# Patient Record
Sex: Female | Born: 1995
Health system: Southern US, Community
[De-identification: ages and names within clinical notes are randomized; demographics above are authoritative.]

## PROBLEM LIST (undated history)

## (undated) DIAGNOSIS — E282 Polycystic ovarian syndrome: Secondary | ICD-10-CM

## (undated) DIAGNOSIS — L309 Dermatitis, unspecified: Secondary | ICD-10-CM

## (undated) HISTORY — PX: OTHER SURGICAL HISTORY: SHX169

---
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
## 2017-05-07 MED FILL — AMOXICILLIN 500 MG CAPSULE: 500 | 7 days supply | Qty: 21 | Fill #0

## 2017-05-21 ENCOUNTER — Encounter: Payer: Self-pay | Admitting: Family Medicine

## 2017-05-21 ENCOUNTER — Ambulatory Visit (INDEPENDENT_AMBULATORY_CARE_PROVIDER_SITE_OTHER): Payer: 59 | Admitting: Family Medicine

## 2017-05-21 VITALS — BP 110/72 | HR 88 | Temp 98.6°F | Ht 64.0 in | Wt 221.2 lb

## 2017-05-21 DIAGNOSIS — Z0000 Encounter for general adult medical examination without abnormal findings: Secondary | ICD-10-CM

## 2017-05-21 DIAGNOSIS — Z114 Encounter for screening for human immunodeficiency virus [HIV]: Secondary | ICD-10-CM

## 2017-05-21 DIAGNOSIS — Z23 Encounter for immunization: Secondary | ICD-10-CM | POA: Diagnosis not present

## 2017-05-21 LAB — LIPID PANEL
CHOL/HDL RATIO: 3
Cholesterol: 138 mg/dL (ref 0–200)
HDL: 40.2 mg/dL (ref >39.00)
LDL Cholesterol: 84 mg/dL (ref 0–99)
NONHDL: 97.42
TRIGLYCERIDES: 69 mg/dL (ref 0.0–149.0)
VLDL: 13.8 mg/dL (ref 0.0–40.0)

## 2017-05-21 LAB — COMPREHENSIVE METABOLIC PANEL
ALK PHOS: 39 U/L — AB (ref 47–119)
ALT: 12 U/L (ref 0–35)
AST: 9 U/L (ref 0–37)
Albumin: 3.9 g/dL (ref 3.5–5.2)
BILIRUBIN TOTAL: 0.7 mg/dL (ref 0.2–1.2)
BUN: 10 mg/dL (ref 6–23)
CALCIUM: 9.4 mg/dL (ref 8.4–10.5)
CO2: 26 meq/L (ref 19–32)
CREATININE: 0.7 mg/dL (ref 0.40–1.20)
Chloride: 105 mEq/L (ref 96–112)
GFR: 138.16 mL/min (ref >60.00)
Glucose, Bld: 83 mg/dL (ref 70–99)
Potassium: 3.9 mEq/L (ref 3.5–5.1)
Sodium: 137 mEq/L (ref 135–145)
Total Protein: 6.8 g/dL (ref 6.0–8.3)

## 2017-05-21 LAB — CBC
HCT: 36.3 % (ref 36.0–49.0)
HEMOGLOBIN: 12.2 g/dL (ref 12.0–16.0)
MCHC: 33.7 g/dL (ref 31.0–37.0)
MCV: 92.3 fl (ref 78.0–98.0)
PLATELETS: 443 10*3/uL (ref 150.0–575.0)
RBC: 3.94 Mil/uL (ref 3.80–5.70)
RDW: 13.5 % (ref 11.4–15.5)
WBC: 6 10*3/uL (ref 4.5–13.5)

## 2017-05-21 NOTE — Progress Notes (Signed)
Pre visit review using our clinic review tool, if applicable. No additional management support is needed unless otherwise documented below in the visit note. 

## 2017-05-21 NOTE — Patient Instructions (Addendum)
Aim to do some physical exertion for 150 minutes per week. This is typically divided into 5 days per week, 30 minutes per day. The activity should be enough to get your heart rate up. Anything is better than nothing if you have time constraints.  Keep the diet clean.  Give us 2-3 business days to get the results of your labs back. If labs are normal, you will likely receive a letter in the mail unless you have MyChart. This can take longer than 2-3 business days.   Let us know if you need anything.

## 2017-05-21 NOTE — Progress Notes (Signed)
Chief Complaint  Patient presents with  . Annual Exam     Well Woman Lisa Franklin is here for a complete physical.   Her last physical was >1 year ago.  Current diet: in general, a "healthy" diet. Current exercise: walking. Weight is stable and she denies daytime fatigue. No LMP recorded.  Seatbelt? Yes  Health Maintenance STI- N/A  Tetanus- No HIV screening- No  Past Medical History:  Diagnosis Date  . Eczema   . PCOS (polycystic ovarian syndrome)      Past Surgical History:  Procedure Laterality Date  . Unremarkable      Medications  Takes no meds routinely.   Allergies No Known Allergies  Review of Systems: Constitutional:  no fevers Eye:  no recent significant change in vision Ear/Nose/Mouth/Throat:  Ears:  no tinnitus or vertigo and no recent change in hearing, Nose/Mouth/Throat:  no complaints of nasal congestion, no sore throat Cardiovascular: no chest pain, no palpitations Respiratory:  no cough and no shortness of breath Gastrointestinal:  no abdominal pain, no change in bowel habits, no significant change in appetite, no nausea, vomiting, diarrhea, or constipation and no black or bloody stool GU:  Female: negative for dysuria, frequency, and incontinence; no abnormal bleeding, pelvic pain, or discharge Musculoskeletal/Extremities:  no pain of the joints Integumentary (Skin/Breast):  no abnormal skin lesions reported Neurologic:  no headaches Endocrine:  denies fatigue Hematologic/Lymphatic:  no unexpected weight changes  Exam BP 110/72 (BP Location: Left Arm, Patient Position: Sitting, Cuff Size: Large)   Pulse 88   Temp 98.6 F (37 C) (Oral)   Ht 5\' 4"  (1.626 m)   Wt 221 lb 4 oz (100.4 kg)   SpO2 99%   BMI 37.98 kg/m  General:  well developed, well nourished, in no apparent distress Skin:  no significant moles, warts, or growths Head:  no masses, lesions, or tenderness Eyes:  pupils equal and round, sclera anicteric without injection Ears:   canals without lesions, TMs shiny without retraction, no obvious effusion, no erythema Nose:  nares patent, septum midline, mucosa normal, and no drainage or sinus tenderness Throat/Pharynx:  lips and gingiva without lesion; tongue and uvula midline; non-inflamed pharynx; no exudates or postnasal drainage Neck: neck supple without adenopathy, thyromegaly, or masses Breasts:  Not done Thorax:  nontender Lungs:  clear to auscultation, breath sounds equal bilaterally, no respiratory distress Cardio:  regular rate and rhythm without murmurs, heart sounds without clicks or rubs, point of maximal impulse normal; no lifts, heaves, or thrills Abdomen:  abdomen soft, nontender; bowel sounds normal; no masses or organomegaly Genital: Defer to GYN Musculoskeletal:  symmetrical muscle groups noted without atrophy or deformity Extremities:  no clubbing, cyanosis, or edema, no deformities, no skin discoloration Neuro:  gait normal; deep tendon reflexes normal and symmetric Psych: well oriented with normal range of affect and appropriate judgment/insight  Assessment and Plan  Well adult exam - Plan: CBC, Comprehensive metabolic panel, Lipid panel  Screening for HIV (human immunodeficiency virus) - Plan: HIV antibody   Well 22 y.o. female. Counseled on diet and exercise. Tdap today also given. Other orders as above. Follow up in 1 yr or prn. The patient voiced understanding and agreement to the plan.  Jilda Rocheicholas Paul [place], DO 05/21/17 8:34 AM

## 2017-05-21 NOTE — Addendum Note (Signed)
Addended by: Scharlene GlossEWING, ROBIN B on: 05/21/2017 08:43 AM   Modules accepted: Orders

## 2017-05-22 LAB — HIV ANTIBODY (ROUTINE TESTING W REFLEX): HIV 1&2 Ab, 4th Generation: NONREACTIVE

## 2017-10-23 ENCOUNTER — Encounter: Payer: Self-pay | Admitting: Obstetrics & Gynecology

## 2017-10-23 ENCOUNTER — Ambulatory Visit (INDEPENDENT_AMBULATORY_CARE_PROVIDER_SITE_OTHER): Payer: 59 | Admitting: Obstetrics & Gynecology

## 2017-10-23 VITALS — BP 120/67 | HR 67 | Ht 64.0 in | Wt 232.0 lb

## 2017-10-23 DIAGNOSIS — Z0000 Encounter for general adult medical examination without abnormal findings: Secondary | ICD-10-CM

## 2017-10-23 DIAGNOSIS — Z01419 Encounter for gynecological examination (general) (routine) without abnormal findings: Secondary | ICD-10-CM

## 2017-10-23 DIAGNOSIS — E282 Polycystic ovarian syndrome: Secondary | ICD-10-CM

## 2017-10-23 NOTE — Progress Notes (Signed)
Subjective:     Lisa Franklin is a 22 y.o. female here for a routine exam.  Current complaints: first GYN exam.  Pt is not sexually active.  Pt has never been on contraception. Pt was dx'd with PCOS for period control. Now pt is having cycles still irreg. Pt has a cycle once a month. Her cycle lasts 1 weeks and is heavy initially.  Her menses are assoc with cramping.   Pt is s/p STI testing since her episode of intercourse and it was neg.    Gynecologic History Patient's last menstrual period was 10/05/2017. Contraception: abstinence Last Pap: n/a.    Obstetric History OB History  Gravida Para Term Preterm AB Living  0 0 0 0 0 0  SAB TAB Ectopic Multiple Live Births  0 0 0 0 0    The following portions of the patient's history were reviewed and updated as appropriate: allergies, current medications, past family history, past medical history, past social history, past surgical history and problem list.  Review of Systems Pertinent items are noted in HPI.    Objective:  BP 120/67   Pulse 67   Ht 5\' 4"  (1.626 m)   Wt 232 lb (105.2 kg)   LMP 10/05/2017   BMI 39.82 kg/m  General Appearance:    Alert, cooperative, no distress, appears stated age  Head:    Normocephalic, without obvious abnormality, atraumatic  Eyes:    conjunctiva/corneas clear, EOM's intact, both eyes  Ears:    Normal external ear canals, both ears  Nose:   Nares normal, septum midline, mucosa normal, no drainage    or sinus tenderness  Throat:   Lips, mucosa, and tongue normal; teeth and gums normal  Neck:   Supple, symmetrical, trachea midline, no adenopathy;    thyroid:  no enlargement/tenderness/nodules  Back:     Symmetric, no curvature, ROM normal, no CVA tenderness  Lungs:     Clear to auscultation bilaterally, respirations unlabored  Chest Wall:    No tenderness or deformity   Heart:    Regular rate and rhythm, S1 and S2 normal, no murmur, rub   or gallop  Breast Exam:    No tenderness, masses, or  nipple abnormality  Abdomen:     Soft, non-tender, bowel sounds active all four quadrants,    no masses, no organomegaly  Genitalia:    Normal female without lesion, discharge or tenderness     Extremities:   Extremities normal, atraumatic, no cyanosis or edema  Pulses:   2+ and symmetric all extremities  Skin:   Skin color, texture, turgor normal, no rashes or lesions     Assessment:    Healthy female exam.   PCOS  Plan:    Follow up in: 1 year.    Reviewed weigh management and made suggestions for weight loss.  Inocente Krach L. Harraway-Smith, M.D., Evern CoreFACOG

## 2017-10-26 ENCOUNTER — Encounter: Payer: Self-pay | Admitting: Obstetrics & Gynecology

## 2018-03-11 ENCOUNTER — Encounter (HOSPITAL_BASED_OUTPATIENT_CLINIC_OR_DEPARTMENT_OTHER): Payer: Self-pay | Admitting: Emergency Medicine

## 2018-03-11 ENCOUNTER — Emergency Department (HOSPITAL_BASED_OUTPATIENT_CLINIC_OR_DEPARTMENT_OTHER)
Admission: EM | Admit: 2018-03-11 | Discharge: 2018-03-11 | Disposition: A | Discharge status: Home / Self Care | Payer: 59 | Attending: Emergency Medicine | Admitting: Emergency Medicine

## 2018-03-11 ENCOUNTER — Other Ambulatory Visit: Payer: Self-pay

## 2018-03-11 DIAGNOSIS — J020 Streptococcal pharyngitis: Secondary | ICD-10-CM | POA: Diagnosis not present

## 2018-03-11 DIAGNOSIS — R070 Pain in throat: Secondary | ICD-10-CM | POA: Diagnosis present

## 2018-03-11 DIAGNOSIS — J029 Acute pharyngitis, unspecified: Secondary | ICD-10-CM | POA: Insufficient documentation

## 2018-03-11 LAB — GROUP A STREP BY PCR: Group A Strep by PCR: NOT DETECTED

## 2018-03-11 MED ORDER — DEXAMETHASONE 6 MG PO TABS
10.0000 mg | ORAL_TABLET | Freq: Once | ORAL | Status: AC
  Administered 2018-03-11: 10 mg via ORAL
  Filled 2018-03-11: qty 1

## 2018-03-11 MED ORDER — CLINDAMYCIN HCL 300 MG PO CAPS: 300.0000 mg | ORAL_CAPSULE | Freq: Three times a day (TID) | ORAL | 0 refills | Status: AC

## 2018-03-11 MED FILL — CLINDAMYCIN HCL 300 MG CAPS: 300 | 10 days supply | Qty: 30 | Fill #0

## 2018-03-11 NOTE — ED Provider Notes (Signed)
[facility] EMERGENCY DEPARTMENT Provider Note   CSN: [id number] Arrival date & time: 03/11/18  [id number]     History   Chief Complaint Chief Complaint  Patient presents with  . Sore Throat    HPI Lisa Franklin is a 22 y.o. female.  The history is provided by the patient.  Sore Throat  This is a new problem. The current episode started more than 2 days ago. The problem occurs daily. The problem has not changed since onset.Pertinent negatives include no chest pain, no abdominal pain and no shortness of breath. Nothing aggravates the symptoms. Nothing relieves the symptoms. She has tried nothing for the symptoms. The treatment provided no relief.    Past Medical History:  Diagnosis Date  . Eczema   . PCOS (polycystic ovarian syndrome)     Patient Active Problem List   Diagnosis Date Noted  . Eczema 04/03/2015  . PCOS (polycystic ovarian syndrome) 10/13/2013  . Acanthosis nigricans 10/06/2013    Past Surgical History:  Procedure Laterality Date  . Unremarkable       OB History    Gravida  0   Para  0   Term  0   Preterm  0   AB  0   Living  0     SAB  0   TAB  0   Ectopic  0   Multiple  0   Live Births  0            Home Medications    Prior to Admission medications   Medication Sig Start Date End Date Taking? Authorizing Provider  clindamycin (CLEOCIN) 300 MG capsule Take 1 capsule (300 mg total) by mouth 3 (three) times daily for 10 days. 03/11/18 03/21/18  Virgina Norfolkuratolo, Santhiago Collingsworth, DO    Family History Family History  Problem Relation Age of Onset  . Hypertension Mother        Living  . Hyperlipidemia Mother   . Cancer - Other Mother        Endometrium  . GER disease Mother   . Hypertension Father        living  . Diabetes Maternal Grandmother   . Hypertension Maternal Grandmother   . Hyperlipidemia Maternal Grandmother   . Heart disease Maternal Grandfather   . Hypertension Maternal Grandfather   . Diabetes Maternal Aunt       x3  . Heart disease Maternal Aunt        x1  . Hypertension Maternal Aunt        x3  . Asthma Sister     Social History Social History   Tobacco Use  . Smoking status: Never Smoker  . Smokeless tobacco: Never Used  Substance Use Topics  . Alcohol use: No  . Drug use: No     Allergies   Patient has no known allergies.   Review of Systems Review of Systems  Constitutional: Negative for chills and fever.  HENT: Positive for sore throat. Negative for congestion, drooling, ear pain, facial swelling, mouth sores, nosebleeds, sinus pressure, sneezing, tinnitus, trouble swallowing and voice change.   Eyes: Negative for pain and visual disturbance.  Respiratory: Negative for cough and shortness of breath.   Cardiovascular: Negative for chest pain and palpitations.  Gastrointestinal: Negative for abdominal pain and vomiting.  Genitourinary: Negative for dysuria and hematuria.  Musculoskeletal: Negative for arthralgias and back pain.  Skin: Negative for color change and rash.  All other systems reviewed and are negative.  Physical Exam Updated Vital Signs  ED Triage Vitals  Enc Vitals Group     BP 03/11/18 0842 (!) 140/94     Pulse Rate 03/11/18 0842 80     Resp 03/11/18 0842 16     Temp 03/11/18 0842 98.8 F (37.1 C)     Temp Source 03/11/18 0842 Oral     SpO2 03/11/18 0842 99 %     Weight 03/11/18 0841 230 lb (104.3 kg)     Height 03/11/18 0841 5\' 4"  (1.626 m)     Head Circumference --      Peak Flow --      Pain Score 03/11/18 0841 10     Pain Loc --      Pain Edu? --      Excl. in GC? --     Physical Exam Vitals signs and nursing note reviewed.  Constitutional:      General: She is not in acute distress.    Appearance: She is well-developed.  HENT:     Head: Normocephalic and atraumatic.     Right Ear: Tympanic membrane normal. Tympanic membrane is not erythematous.     Left Ear: Tympanic membrane normal. Tympanic membrane is not erythematous.      Mouth/Throat:     Mouth: Mucous membranes are moist. No oral lesions.     Pharynx: Uvula midline. No pharyngeal swelling, oropharyngeal exudate, posterior oropharyngeal erythema or uvula swelling.     Tonsils: Tonsillar exudate present. No tonsillar abscesses. Swelling: 2+ on the right. 3+ on the left.  Eyes:     Conjunctiva/sclera: Conjunctivae normal.  Neck:     Musculoskeletal: Neck supple.  Cardiovascular:     Rate and Rhythm: Normal rate and regular rhythm.     Heart sounds: No murmur.  Pulmonary:     Effort: Pulmonary effort is normal. No respiratory distress.     Breath sounds: Normal breath sounds.  Abdominal:     Palpations: Abdomen is soft.     Tenderness: There is no abdominal tenderness.  Skin:    General: Skin is warm and dry.     Capillary Refill: Capillary refill takes less than 2 seconds.  Neurological:     General: No focal deficit present.     Mental Status: She is alert.      ED Treatments / Results  Labs (all labs ordered are listed, but only abnormal results are displayed) Labs Reviewed  GROUP A STREP BY PCR    EKG None  Radiology No results found.  Procedures Procedures (including critical care time)  Medications Ordered in ED Medications  dexamethasone (DECADRON) tablet 10 mg (10 mg Oral Given 03/11/18 0940)     Initial Impression / Assessment and Plan / ED Course  I have reviewed the triage vital signs and the nursing notes.  Pertinent labs & imaging results that were available during my care of the patient were reviewed by me and considered in my medical decision making (see chart for details).     Lisa Franklin is a 22 year old female with no significant medical history presents to the ED with sore throat.  Patient with normal vitals.  No fever.  Patient with symptoms over the last several days.  Patient with negative strep test done already in triage.  Has bilateral tonsil swelling that is slightly worse on the left.  Uvula is  midline.  Likely early abscess but patient without any significant tenderness on exam.  No trismus, no drooling, no voice change,  no difficulty handling secretions.  Likely no major abscess at this time and likely just local inflammation, no need for any drainage at this time.  Talked with Dr. Annalee Genta on the phone to ensure good follow-up.  Patient given Decadron while in the ED and given prescription for clindamycin.  Given return precautions and recommend follow-up with ENT.  Discharged from ED in good condition.  This chart was dictated using voice recognition software.  Despite best efforts to proofread,  errors can occur which can change the documentation meaning.   Final Clinical Impressions(s) / ED Diagnoses   Final diagnoses:  Pharyngitis, unspecified etiology    ED Discharge Orders         Ordered    clindamycin (CLEOCIN) 300 MG capsule  3 times daily     03/11/18 1016           Damean Poffenberger, DO 03/11/18 1020

## 2018-03-11 NOTE — ED Triage Notes (Signed)
Sore throat x 1 week

## 2018-05-28 DIAGNOSIS — L2089 Other atopic dermatitis: Secondary | ICD-10-CM | POA: Diagnosis not present

## 2018-08-05 MED FILL — HYDROCODON-APAP 5-325: 5-325 | 5 days supply | Qty: 20 | Fill #0

## 2018-08-05 MED FILL — PENICILLIN VK 500 MG TABLET: 500 | 14 days supply | Qty: 56 | Fill #0

## 2018-08-05 MED FILL — CHLORHEXIDINE 0.12% RINSE: 0.12 | 16 days supply | Qty: 473 | Fill #0

## 2018-11-03 ENCOUNTER — Other Ambulatory Visit (INDEPENDENT_AMBULATORY_CARE_PROVIDER_SITE_OTHER): Payer: 59

## 2018-11-03 ENCOUNTER — Other Ambulatory Visit: Payer: Self-pay

## 2018-11-03 VITALS — BP 116/93 | HR 89 | Ht 63.0 in | Wt 273.0 lb

## 2018-11-03 DIAGNOSIS — N898 Other specified noninflammatory disorders of vagina: Secondary | ICD-10-CM | POA: Diagnosis not present

## 2018-11-03 NOTE — Progress Notes (Signed)
Patient here for abnormal discharge. Patient thinks it is actually her period that is becoming irregular "because weight gain".   Patient would still like to do self swab for BV and yeast. Denies need screening for STD due to no sexual activity at this time. Kathrene Alu RN  Attestation of Attending Supervision of RN: Evaluation and management procedures were performed by the nurse under my supervision and collaboration.  I have reviewed the nursing note and chart, and I agree with the management and plan.  Carolyn L. Harraway-Smith, M.D., Cherlynn June

## 2018-11-05 LAB — CERVICOVAGINAL ANCILLARY ONLY
Bacterial vaginitis: NEGATIVE
Candida vaginitis: NEGATIVE

## 2018-11-08 ENCOUNTER — Telehealth: Payer: Self-pay

## 2018-11-08 NOTE — Telephone Encounter (Signed)
Patient called and made aware that BV and yeast cultures were both negative. Patient states "are you sure" and made her aware that is what the results tell me and verified her DOB again.  Patient encouraged to keep follow up appt with Dr. Ihor Dow in Sept. Kathrene Alu RN

## 2018-11-25 ENCOUNTER — Encounter: Payer: Self-pay | Admitting: Obstetrics & Gynecology

## 2018-11-25 ENCOUNTER — Other Ambulatory Visit: Payer: Self-pay

## 2018-11-25 ENCOUNTER — Ambulatory Visit (INDEPENDENT_AMBULATORY_CARE_PROVIDER_SITE_OTHER): Payer: 59 | Admitting: Obstetrics & Gynecology

## 2018-11-25 VITALS — BP 118/56 | HR 72 | Ht 63.0 in | Wt 276.0 lb

## 2018-11-25 DIAGNOSIS — Z113 Encounter for screening for infections with a predominantly sexual mode of transmission: Secondary | ICD-10-CM | POA: Diagnosis not present

## 2018-11-25 DIAGNOSIS — E282 Polycystic ovarian syndrome: Secondary | ICD-10-CM | POA: Diagnosis not present

## 2018-11-25 DIAGNOSIS — Z3202 Encounter for pregnancy test, result negative: Secondary | ICD-10-CM

## 2018-11-25 DIAGNOSIS — N915 Oligomenorrhea, unspecified: Secondary | ICD-10-CM

## 2018-11-25 LAB — POCT URINE PREGNANCY: Preg Test, Ur: NEGATIVE

## 2018-11-25 MED ORDER — NORETHIN ACE-ETH ESTRAD-FE 1-20 MG-MCG(24) PO TABS: 1.0000 | ORAL_TABLET | Freq: Every day | ORAL | 11 refills | Status: DC

## 2018-11-25 MED FILL — BLISOVI 24 FE 1-20 MG-MCG(2: 1-20 | 84 days supply | Qty: 84 | Fill #0

## 2018-11-25 NOTE — Patient Instructions (Signed)
Exercising to Lose Weight Exercise is structured, repetitive physical activity to improve fitness and health. Getting regular exercise is important for everyone. It is especially important if you are overweight. Being overweight increases your risk of heart disease, stroke, diabetes, high blood pressure, and several types of cancer. Reducing your calorie intake and exercising can help you lose weight. Exercise is usually categorized as moderate or vigorous intensity. To lose weight, most people need to do a certain amount of moderate-intensity or vigorous-intensity exercise each week. Moderate-intensity exercise  Moderate-intensity exercise is any activity that gets you moving enough to burn at least three times more energy (calories) than if you were sitting. Examples of moderate exercise include:  Walking a mile in 15 minutes.  Doing light yard work.  Biking at an easy pace. Most people should get at least 150 minutes (2 hours and 30 minutes) a week of moderate-intensity exercise to maintain their body weight. Vigorous-intensity exercise Vigorous-intensity exercise is any activity that gets you moving enough to burn at least six times more calories than if you were sitting. When you exercise at this intensity, you should be working hard enough that you are not able to carry on a conversation. Examples of vigorous exercise include:  Running.  Playing a team sport, such as football, basketball, and soccer.  Jumping rope. Most people should get at least 75 minutes (1 hour and 15 minutes) a week of vigorous-intensity exercise to maintain their body weight. How can exercise affect me? When you exercise enough to burn more calories than you eat, you lose weight. Exercise also reduces body fat and builds muscle. The more muscle you have, the more calories you burn. Exercise also:  Improves mood.  Reduces stress and tension.  Improves your overall fitness, flexibility, and endurance.   Increases bone strength. The amount of exercise you need to lose weight depends on:  Your age.  The type of exercise.  Any health conditions you have.  Your overall physical ability. Talk to your health care provider about how much exercise you need and what types of activities are safe for you. What actions can I take to lose weight? Nutrition   Make changes to your diet as told by your health care provider or diet and nutrition specialist (dietitian). This may include: ? Eating fewer calories. ? Eating more protein. ? Eating less unhealthy fats. ? Eating a diet that includes fresh fruits and vegetables, whole grains, low-fat dairy products, and lean protein. ? Avoiding foods with added fat, salt, and sugar.  Drink plenty of water while you exercise to prevent dehydration or heat stroke. Activity  Choose an activity that you enjoy and set realistic goals. Your health care provider can help you make an exercise plan that works for you.  Exercise at a moderate or vigorous intensity most days of the week. ? The intensity of exercise may vary from person to person. You can tell how intense a workout is for you by paying attention to your breathing and heartbeat. Most people will notice their breathing and heartbeat get faster with more intense exercise.  Do resistance training twice each week, such as: ? Push-ups. ? Sit-ups. ? Lifting weights. ? Using resistance bands.  Getting short amounts of exercise can be just as helpful as long structured periods of exercise. If you have trouble finding time to exercise, try to include exercise in your daily routine. ? Get up, stretch, and walk around every 30 minutes throughout the day. ? Go for a   walk during your lunch break. ? Park your car farther away from your destination. ? If you take public transportation, get off one stop early and walk the rest of the way. ? Make phone calls while standing up and walking around. ? Take the  stairs instead of elevators or escalators.  Wear comfortable clothes and shoes with good support.  Do not exercise so much that you hurt yourself, feel dizzy, or get very short of breath. Where to find more information  [place] Department of Health and Human Services: www.hhs.gov  Centers for Disease Control and Prevention (CDC): www.cdc.gov Contact a health care provider:  Before starting a new exercise program.  If you have questions or concerns about your weight.  If you have a medical problem that keeps you from exercising. Get help right away if you have any of the following while exercising:  Injury.  Dizziness.  Difficulty breathing or shortness of breath that does not go away when you stop exercising.  Chest pain.  Rapid heartbeat. Summary  Being overweight increases your risk of heart disease, stroke, diabetes, high blood pressure, and several types of cancer.  Losing weight happens when you burn more calories than you eat.  Reducing the amount of calories you eat in addition to getting regular moderate or vigorous exercise each week helps you lose weight. This information is not intended to replace advice given to you by your health care provider. Make sure you discuss any questions you have with your health care provider. Document Released: 04/05/2010 Document Revised: 03/16/2017 Document Reviewed: 03/16/2017 Elsevier Patient Education  2020 [employer] Diet for Polycystic Ovary Syndrome Polycystic ovary syndrome (PCOS) is a disorder of the chemicals (hormones) that regulate a woman's reproductive system, including monthly periods (menstruation). The condition causes important hormones to be out of balance. PCOS can:  Stop your periods or make them irregular.  Cause cysts to develop on your ovaries.  Make it difficult to get pregnant.  Stop your body from responding to the effects of insulin (insulin resistance). Insulin resistance can lead to obesity and  diabetes. Changing what you eat can help you manage PCOS and improve your health. Following a balanced diet can help you lose weight and improve the way that your body uses insulin. What are tips for following this plan?  Follow a balanced diet for meals and snacks. Eat breakfast, lunch, dinner, and one or two snacks every day.  Include protein in each meal and snack.  Choose whole grains instead of products that are made with refined flour.  Eat a variety of foods.  Exercise regularly as told by your health care provider. Aim to do 30 or more minutes of exercise on most days of the week.  If you are overweight or obese: ? Pay attention to how many calories you eat. Cutting down on calories can help you lose weight. ? Work with your health care provider or a diet and nutrition specialist (dietitian) to figure out how many calories you need each day. What foods can I eat?  Fruits Include a variety of colors and types. All fruits are helpful for PCOS. Vegetables Include a variety of colors and types. All vegetables are helpful for PCOS. Grains Whole grains, such as whole wheat. Whole-grain breads, crackers, cereals, and pasta. Unsweetened oatmeal, bulgur, barley, quinoa, and brown rice. Tortillas made from corn or whole-wheat flour. Meats and other proteins Low-fat (lean) proteins, such as fish, chicken, beans, eggs, and tofu. Dairy Low-fat dairy products, such as   skim milk, cheese sticks, and yogurt. Beverages Low-fat or fat-free drinks, such as water, low-fat milk, sugar-free drinks, and small amounts of 100% fruit juice. Seasonings and condiments Ketchup. Mustard. Barbecue sauce. Relish. Low-fat or fat-free mayonnaise. Fats and oils Olive oil or canola oil. Walnuts and almonds. The items listed above may not be a complete list of recommended foods and beverages. Contact a dietitian for more options. What foods are not recommended? Foods that are high in calories or fat. Fried  foods. Sweets. Products that are made from refined white flour, including white bread, pastries, white rice, and pasta. The items listed above may not be a complete list of foods and beverages to avoid. Contact a dietitian for more information. Summary  PCOS is a hormonal imbalance that affects a woman's reproductive system.  You can help to manage your PCOS by exercising regularly and eating a healthy, varied diet of vegetables, fruit, whole grains, low-fat (lean) protein, and low-fat dairy products.  Changing what you eat can improve the way that your body uses insulin, help your hormones reach normal levels, and help you lose weight. This information is not intended to replace advice given to you by your health care provider. Make sure you discuss any questions you have with your health care provider. Document Released: 06/25/2015 Document Revised: 06/23/2018 Document Reviewed: 01/05/2017 Elsevier Patient Education  2020 Else[employer] Inc. Polycystic Ovarian Syndrome  Polycystic ovarian syndrome (PCOS) is a common hormonal disorder among women of reproductive age. In most women with PCOS, many small fluid-filled sacs (cysts) grow on the ovaries, and the cysts are not part of a normal menstrual cycle. PCOS can cause problems with your menstrual periods and make it difficult to get pregnant. It can also cause an increased risk of miscarriage with pregnancy. If it is not treated, PCOS can lead to serious health problems, such as diabetes and heart disease. What are the causes? The cause of PCOS is not known, but it may be the result of a combination of certain factors, such as:  Irregular menstrual cycle.  High levels of certain hormones (androgens).  Problems with the hormone that helps to control blood sugar (insulin resistance).  Certain genes. What increases the risk? This condition is more likely to develop in women who have a family history of PCOS. What are the signs or symptoms?  Symptoms of PCOS may include:  Multiple ovarian cysts.  Infrequent periods or no periods.  Periods that are too frequent or too heavy.  Unpredictable periods.  Inability to get pregnant (infertility) because of not ovulating.  Increased growth of hair on the face, chest, stomach, back, thumbs, thighs, or toes.  Acne or oily skin. Acne may develop during adulthood, and it may not respond to treatment.  Pelvic pain.  Weight gain or obesity.  Patches of thickened and dark brown or black skin on the neck, arms, breasts, or thighs (acanthosis nigricans).  Excess hair growth on the face, chest, abdomen, or upper thighs (hirsutism). How is this diagnosed? This condition is diagnosed based on:  Your medical history.  A physical exam, including a pelvic exam. Your health care provider may look for areas of increased hair growth on your skin.  Tests, such as: ? Ultrasound. This may be used to examine the ovaries and the lining of the uterus (endometrium) for cysts. ? Blood tests. These may be used to check levels of sugar (glucose), female hormone (testosterone), and female hormones (estrogen and progesterone) in your blood. How is this treated?   There is no cure for PCOS, but treatment can help to manage symptoms and prevent more health problems from developing. Treatment varies depending on:  Your symptoms.  Whether you want to have a baby or whether you need birth control (contraception). Treatment may include nutrition and lifestyle changes along with:  Progesterone hormone to start a menstrual period.  Birth control pills to help you have regular menstrual periods.  Medicines to make you ovulate, if you want to get pregnant.  Medicine to reduce excessive hair growth.  Surgery, in severe cases. This may involve making small holes in one or both of your ovaries. This decreases the amount of testosterone that your body produces. Follow these instructions at home:  Take  over-the-counter and prescription medicines only as told by your health care provider.  Follow a healthy meal plan. This can help you reduce the effects of PCOS. ? Eat a healthy diet that includes lean proteins, complex carbohydrates, fresh fruits and vegetables, low-fat dairy products, and healthy fats. Make sure to eat enough fiber.  If you are overweight, lose weight as told by your health care provider. ? Losing 10% of your body weight may improve symptoms. ? Your health care provider can determine how much weight loss is best for you and can help you lose weight safely.  Keep all follow-up visits as told by your health care provider. This is important. Contact a health care provider if:  Your symptoms do not get better with medicine.  You develop new symptoms. This information is not intended to replace advice given to you by your health care provider. Make sure you discuss any questions you have with your health care provider. Document Released: 06/27/2004 Document Revised: 02/13/2017 Document Reviewed: 08/19/2015 Elsevier Patient Education  2020 Elsevier Inc.  

## 2018-11-25 NOTE — Progress Notes (Signed)
History:  23 y.o. G0P0000 here today for eval of oilgomenorrhea. She was prev dx'd with PCOS.  In the past her cycles have improved with weight loss. Pt reports recent weight gain of 20-30#s.  The following portions of the patient's history were reviewed and updated as appropriate: allergies, current medications, past family history, past medical history, past social history, past surgical history and problem list.  Review of Systems:  Pertinent items are noted in HPI.    Objective:  Physical Exam Blood pressure (!) 118/56, pulse 72, height 5\' 3"  (1.6 m), weight 276 lb (125.2 kg), last menstrual period 11/11/2018.  CONSTITUTIONAL: Well-developed, well-nourished female in no acute distress.  HENT:  Normocephalic, atraumatic EYES: Conjunctivae and EOM are normal. No scleral icterus.  NECK: Normal range of motion SKIN: Skin is warm and dry. No rash noted. Not diaphoretic.No pallor. Delia: Alert and oriented to person, place, and time. Normal coordination.   Lab: UPT neg   Assessment & Plan:  Oligomenorrhea suspect due to PCOS  Reviewed diet and exercise   My[employer]itness App   Walking 6 days/ week  Review lab results from last visit     Contraception management and cycle control   Loestrin 1/20 1 po q day F/u in 3 months or sooner prn   Total face-to-face time with patient was 25 min.  Greater than 50% was spent in counseling and coordination of care with the patient.   Jaelee Laughter L. Harraway-Smith, M.D., Cherlynn June

## 2018-11-26 ENCOUNTER — Telehealth: Payer: Self-pay

## 2018-11-26 ENCOUNTER — Encounter: Payer: Self-pay | Admitting: Obstetrics & Gynecology

## 2018-11-26 LAB — CERVICOVAGINAL ANCILLARY ONLY
Chlamydia: NEGATIVE
Neisseria Gonorrhea: NEGATIVE
Trichomonas: NEGATIVE

## 2018-11-26 NOTE — Telephone Encounter (Signed)
-----   Message from Lavonia Drafts, MD sent at 11/26/2018  9:47 AM EDT ----- Please call pt. Her STI screen from yesterday were all neg.  Thx,  Clh-S

## 2018-11-26 NOTE — Telephone Encounter (Signed)
Called pt regarding results. Unable to leave message because phone rang 3x then gave a busy signal. Lisa Franklin l Lisa Franklin, CMA

## 2019-02-09 ENCOUNTER — Emergency Department (HOSPITAL_BASED_OUTPATIENT_CLINIC_OR_DEPARTMENT_OTHER)
Admission: EM | Admit: 2019-02-09 | Discharge: 2019-02-09 | Disposition: A | Discharge status: Home / Self Care | Payer: 59 | Attending: Emergency Medicine | Admitting: Emergency Medicine

## 2019-02-09 ENCOUNTER — Encounter (HOSPITAL_BASED_OUTPATIENT_CLINIC_OR_DEPARTMENT_OTHER): Payer: Self-pay

## 2019-02-09 ENCOUNTER — Other Ambulatory Visit: Payer: Self-pay

## 2019-02-09 DIAGNOSIS — Z79899 Other long term (current) drug therapy: Secondary | ICD-10-CM | POA: Insufficient documentation

## 2019-02-09 DIAGNOSIS — H9203 Otalgia, bilateral: Secondary | ICD-10-CM | POA: Diagnosis present

## 2019-02-09 DIAGNOSIS — H9190 Unspecified hearing loss, unspecified ear: Secondary | ICD-10-CM | POA: Diagnosis not present

## 2019-02-09 DIAGNOSIS — H66002 Acute suppurative otitis media without spontaneous rupture of ear drum, left ear: Secondary | ICD-10-CM | POA: Diagnosis not present

## 2019-02-09 MED ORDER — AMOXICILLIN 500 MG PO CAPS: 500.0000 mg | ORAL_CAPSULE | Freq: Three times a day (TID) | ORAL | 0 refills | Status: AC

## 2019-02-09 MED ORDER — NEOMYCIN-POLYMYXIN-HC 3.5-10000-1 OT SUSP: 4.0000 [drp] | Freq: Three times a day (TID) | OTIC | 0 refills | Status: AC

## 2019-02-09 MED FILL — AMOXICILLIN 500 MG CAPSULE: 500 | 7 days supply | Qty: 21 | Fill #0

## 2019-02-09 NOTE — Discharge Instructions (Signed)
Take amoxicillin as prescribed for 7 days for your infection in your left ear.  Use Cortisporin in both ears for the next 5 days to help prevent ear canal infection after earwax removal.  Please follow-up with your doctor if your symptoms are not improving.  Please return the emergency department if you develop increasing pain, pain in the bone behind your ear, fever, or any other concerning symptoms.

## 2019-02-09 NOTE — ED Triage Notes (Signed)
Pt reports using "some penicillin from the dentist"

## 2019-02-09 NOTE — ED Triage Notes (Signed)
Pt reports bilateral ear pain since Monday. Pt reports using sweet oil for pain, with no relief.

## 2019-02-09 NOTE — ED Provider Notes (Signed)
[facility] EMERGENCY DEPARTMENT Provider Note   CSN: [id number] Arrival date & time: 02/09/19  1032     History   Chief Complaint Chief Complaint  Patient presents with  . Otalgia    HPI Lisa Franklin is a 23 y.o. female with history of eczema, PCOS who presents with a 4-day history of bilateral ear pain.  Patient has had decreased hearing and fullness.  She denies any fevers, nasal congestion, sore throat.  She has tried sweet oil and Tylenol without relief.  She has also tried some penicillin that she got from her dentist.  Patient reports she has had problems with her ears in the past, but not like this.     HPI  Past Medical History:  Diagnosis Date  . Eczema   . PCOS (polycystic ovarian syndrome)     Patient Active Problem List   Diagnosis Date Noted  . Eczema 04/03/2015  . PCOS (polycystic ovarian syndrome) 10/13/2013  . Acanthosis nigricans 10/06/2013    Past Surgical History:  Procedure Laterality Date  . Unremarkable       OB History    Gravida  0   Para  0   Term  0   Preterm  0   AB  0   Living  0     SAB  0   TAB  0   Ectopic  0   Multiple  0   Live Births  0            Home Medications    Prior to Admission medications   Medication Sig Start Date End Date Taking? Authorizing Provider  amoxicillin (AMOXIL) 500 MG capsule Take 1 capsule (500 mg total) by mouth 3 (three) times daily for 7 days. 02/09/19 02/16/19  Emi Holes, PA-C  neomycin-polymyxin-hydrocortisone (CORTISPORIN) 3.5-10000-1 OTIC suspension Place 4 drops into both ears 3 (three) times daily for 5 days. 02/09/19 02/14/19  Emi Holes, PA-C  Norethindrone Acetate-Ethinyl Estrad-FE (LOESTRIN 24 FE) 1-20 MG-MCG(24) tablet Take 1 tablet by mouth daily. 11/25/18   Willodean Rosenthal, MD    Family History Family History  Problem Relation Age of Onset  . Hypertension Mother        Living  . Hyperlipidemia Mother   . Cancer - Other Mother         Endometrium  . GER disease Mother   . Hypertension Father        living  . Diabetes Maternal Grandmother   . Hypertension Maternal Grandmother   . Hyperlipidemia Maternal Grandmother   . Heart disease Maternal Grandfather   . Hypertension Maternal Grandfather   . Diabetes Maternal Aunt        x3  . Heart disease Maternal Aunt        x1  . Hypertension Maternal Aunt        x3  . Asthma Sister     Social History Social History   Tobacco Use  . Smoking status: Never Smoker  . Smokeless tobacco: Never Used  Substance Use Topics  . Alcohol use: No  . Drug use: No     Allergies   Patient has no known allergies.   Review of Systems Review of Systems  Constitutional: Negative for fever.  HENT: Positive for ear pain and hearing loss. Negative for congestion and sore throat.      Physical Exam Updated Vital Signs BP 103/72 (BP Location: Right Arm)   Pulse 85   Temp 98.4 F (36.9  C) (Oral)   Resp 19   Ht 5\' 3"  (1.6 m)   Wt 117.9 kg   LMP 01/26/2019   SpO2 99%   BMI 46.06 kg/m   Physical Exam Vitals signs and nursing note reviewed.  Constitutional:      General: She is not in acute distress.    Appearance: She is well-developed. She is not diaphoretic.  HENT:     Head: Normocephalic and atraumatic.     Right Ear: Tympanic membrane normal. There is impacted cerumen. No mastoid tenderness.     Left Ear: There is impacted cerumen. No mastoid tenderness. Tympanic membrane is erythematous and bulging.     Ears:     Comments: No movement pain bilaterally Eyes:     General: No scleral icterus.       Right eye: No discharge.        Left eye: No discharge.     Conjunctiva/sclera: Conjunctivae normal.     Pupils: Pupils are equal, round, and reactive to light.  Neck:     Musculoskeletal: Normal range of motion and neck supple.     Thyroid: No thyromegaly.  Cardiovascular:     Rate and Rhythm: Normal rate and regular rhythm.     Heart sounds: Normal heart  sounds. No murmur. No friction rub. No gallop.   Pulmonary:     Effort: Pulmonary effort is normal. No respiratory distress.     Breath sounds: Normal breath sounds. No stridor. No wheezing or rales.  Abdominal:     General: There is no distension.  Lymphadenopathy:     Cervical: No cervical adenopathy.  Skin:    General: Skin is warm and dry.     Coloration: Skin is not pale.     Findings: No rash.  Neurological:     Mental Status: She is alert.     Coordination: Coordination normal.      ED Treatments / Results  Labs (all labs ordered are listed, but only abnormal results are displayed) Labs Reviewed - No data to display  EKG None  Radiology No results found.  Procedures Procedures (including critical care time)  Medications Ordered in ED Medications - No data to display   Initial Impression / Assessment and Plan / ED Course  I have reviewed the triage vital signs and the nursing notes.  Pertinent labs & imaging results that were available during my care of the patient were reviewed by me and considered in my medical decision making (see chart for details).        Patient with cerumen impaction bilaterally as well as TM concerning for otitis media on the left following cerumen removal by RN.  Will treat this with amoxicillin.  We will also give prophylactic dosing of Cortisporin following cerumen removal.  No signs of mastoiditis.  Return precautions discussed.  Follow-up to PCP as needed.  Patient vitals stable throughout ED course and discharged in satisfactory condition.  Final Clinical Impressions(s) / ED Diagnoses   Final diagnoses:  Acute suppurative otitis media of left ear without spontaneous rupture of tympanic membrane, recurrence not specified    ED Discharge Orders         Ordered    neomycin-polymyxin-hydrocortisone (CORTISPORIN) 3.5-10000-1 OTIC suspension  3 times daily     02/09/19 1155    amoxicillin (AMOXIL) 500 MG capsule  3 times daily      02/09/19 [address]1155           Najir Roop [place], [place] 02/09/19 1157  Hayden Rasmussen, MD 02/09/19 [id number]

## 2019-03-02 ENCOUNTER — Emergency Department (HOSPITAL_BASED_OUTPATIENT_CLINIC_OR_DEPARTMENT_OTHER)
Admission: EM | Admit: 2019-03-02 | Discharge: 2019-03-02 | Disposition: A | Discharge status: Home / Self Care | Payer: 59

## 2019-03-02 ENCOUNTER — Other Ambulatory Visit: Payer: Self-pay

## 2019-03-02 NOTE — ED Notes (Signed)
Pt wanting free testing for covid.  Explained to patient that it was an ED visit.   Pt decided she would go to [facility] for free testing.

## 2019-06-09 ENCOUNTER — Ambulatory Visit: Payer: 59 | Attending: Internal Medicine

## 2019-07-13 DIAGNOSIS — E282 Polycystic ovarian syndrome: Secondary | ICD-10-CM | POA: Diagnosis not present

## 2019-07-13 DIAGNOSIS — L918 Other hypertrophic disorders of the skin: Secondary | ICD-10-CM | POA: Diagnosis not present

## 2019-07-13 DIAGNOSIS — L308 Other specified dermatitis: Secondary | ICD-10-CM | POA: Diagnosis not present

## 2019-07-13 MED FILL — MONTELUKAST SOD 10 MG TAB: 10 | 30 days supply | Qty: 30 | Fill #0

## 2019-07-13 MED FILL — TRIAMCINOLONE 0.1% OINTMENT: 0.1 | 30 days supply | Qty: 80 | Fill #0

## 2019-07-13 MED FILL — HYDROCORTISONE 2.5% OINT: 2.5 | 28 days supply | Qty: 57 | Fill #0

## 2019-07-14 ENCOUNTER — Ambulatory Visit: Payer: 59 | Attending: Internal Medicine

## 2019-07-14 DIAGNOSIS — Z23 Encounter for immunization: Secondary | ICD-10-CM

## 2019-07-14 NOTE — Progress Notes (Signed)
   Covid-19 Vaccination Clinic  Name:  [name]    MRN: [id number] DOB: [birth year]  07/14/2019  [name] was observed post Covid-19 immunization for 15 minutes without incident. She was provided with Vaccine Information Sheet and instruction to access the V-Safe system.   [name] was instructed to call 911 with any severe reactions post vaccine: Marland Kitchen Difficulty breathing  . Swelling of face and throat  . A fast heartbeat  . A bad rash all over body  . Dizziness and weakness   Immunizations Administered    Name Date Dose VIS Date Route   Pfizer COVID-19 Vaccine 07/14/2019  1:20 PM 0.3 mL 05/11/2018 Intramuscular   Manufacturer: [employer], [employer]   Lot: [id number]   NDC: [address]-8

## 2019-08-08 ENCOUNTER — Ambulatory Visit: Payer: 59 | Attending: Internal Medicine

## 2019-08-08 DIAGNOSIS — Z23 Encounter for immunization: Secondary | ICD-10-CM

## 2019-08-08 NOTE — Progress Notes (Signed)
   Covid-19 Vaccination Clinic  Name:  BREUNNA NORDMANN    MRN: [id number] DOB: 06-Jul-1995  08/08/2019  Ms. Tri was observed post Covid-19 immunization for 15 minutes without incident. She was provided with Vaccine Information Sheet and instruction to access the V-Safe system.   Ms. [place] was instructed to call 911 with any severe reactions post vaccine: Marland Kitchen Difficulty breathing  . Swelling of face and throat  . A fast heartbeat  . A bad rash all over body  . Dizziness and weakness   Immunizations Administered    Name Date Dose VIS Date Route   Pfizer COVID-19 Vaccine 08/08/2019  3:12 PM 0.3 mL 05/11/2018 Intramuscular   Manufacturer: [employer], [employer]   Lot: [id number]   NDC: [address]-4

## 2019-09-08 ENCOUNTER — Other Ambulatory Visit: Payer: Self-pay

## 2019-09-08 ENCOUNTER — Emergency Department (HOSPITAL_BASED_OUTPATIENT_CLINIC_OR_DEPARTMENT_OTHER)
Admission: EM | Admit: 2019-09-08 | Discharge: 2019-09-08 | Disposition: A | Discharge status: Home / Self Care | Payer: 59 | Attending: Emergency Medicine | Admitting: Emergency Medicine

## 2019-09-08 ENCOUNTER — Encounter (HOSPITAL_BASED_OUTPATIENT_CLINIC_OR_DEPARTMENT_OTHER): Payer: Self-pay | Admitting: Emergency Medicine

## 2019-09-08 DIAGNOSIS — N898 Other specified noninflammatory disorders of vagina: Secondary | ICD-10-CM | POA: Diagnosis not present

## 2019-09-08 LAB — URINALYSIS, ROUTINE W REFLEX MICROSCOPIC
Bilirubin Urine: NEGATIVE
Glucose, UA: NEGATIVE mg/dL
Ketones, ur: NEGATIVE mg/dL
Nitrite: NEGATIVE
Protein, ur: NEGATIVE mg/dL
Specific Gravity, Urine: 1.025 (ref 1.005–1.030)
pH: 5 (ref 5.0–8.0)

## 2019-09-08 LAB — WET PREP, GENITAL
Clue Cells Wet Prep HPF POC: NONE SEEN
Sperm: NONE SEEN
Trich, Wet Prep: NONE SEEN
Yeast Wet Prep HPF POC: NONE SEEN

## 2019-09-08 LAB — URINALYSIS, MICROSCOPIC (REFLEX)

## 2019-09-08 LAB — PREGNANCY, URINE: Preg Test, Ur: NEGATIVE

## 2019-09-08 MED ORDER — CEFTRIAXONE SODIUM 500 MG IJ SOLR
500.0000 mg | Freq: Once | INTRAMUSCULAR | Status: AC
  Administered 2019-09-08: 500 mg via INTRAMUSCULAR

## 2019-09-08 MED ORDER — DOXYCYCLINE HYCLATE 100 MG PO TABS
100.0000 mg | ORAL_TABLET | Freq: Once | ORAL | Status: AC
  Administered 2019-09-08: 100 mg via ORAL
  Filled 2019-09-08: qty 1

## 2019-09-08 MED ORDER — CEFTRIAXONE SODIUM 500 MG IJ SOLR: 250.0000 mg | Freq: Once | INTRAMUSCULAR | Status: DC

## 2019-09-08 MED ORDER — CEFTRIAXONE SODIUM 500 MG IJ SOLR
INTRAMUSCULAR | Status: AC
  Filled 2019-09-08: qty 500

## 2019-09-08 NOTE — ED Provider Notes (Signed)
[facility] EMERGENCY DEPARTMENT Provider Note   CSN: [id number] Arrival date & time: 09/08/19  1614     History Chief Complaint  Patient presents with  . Vaginal Discharge    Lisa Franklin is a 24 y.o. female.  Patient is a 24 year old female who presents with vaginal discharge.  She has had a 3-day history of itching and white discharge in her vaginal area.  She denies any urinary symptoms.  No abdominal pain.  No nausea or vomiting.  No fevers.  She is sexually active.  She has a prior history of chlamydia.  She does not use condoms every time.        Past Medical History:  Diagnosis Date  . Eczema   . PCOS (polycystic ovarian syndrome)     Patient Active Problem List   Diagnosis Date Noted  . Eczema 04/03/2015  . PCOS (polycystic ovarian syndrome) 10/13/2013  . Acanthosis nigricans 10/06/2013    Past Surgical History:  Procedure Laterality Date  . Unremarkable       OB History    Gravida  0   Para  0   Term  0   Preterm  0   AB  0   Living  0     SAB  0   TAB  0   Ectopic  0   Multiple  0   Live Births  0           Family History  Problem Relation Age of Onset  . Hypertension Mother        Living  . Hyperlipidemia Mother   . Cancer - Other Mother        Endometrium  . GER disease Mother   . Hypertension Father        living  . Diabetes Maternal Grandmother   . Hypertension Maternal Grandmother   . Hyperlipidemia Maternal Grandmother   . Heart disease Maternal Grandfather   . Hypertension Maternal Grandfather   . Diabetes Maternal Aunt        x3  . Heart disease Maternal Aunt        x1  . Hypertension Maternal Aunt        x3  . Asthma Sister     Social History   Tobacco Use  . Smoking status: Never Smoker  . Smokeless tobacco: Never Used  Substance Use Topics  . Alcohol use: No  . Drug use: No    Home Medications Prior to Admission medications   Medication Sig Start Date End Date Taking?  Authorizing Provider  Norethindrone Acetate-Ethinyl Estrad-FE (LOESTRIN 24 FE) 1-20 MG-MCG(24) tablet Take 1 tablet by mouth daily. 11/25/18   Willodean Rosenthal, MD    Allergies    Patient has no known allergies.  Review of Systems   Review of Systems  Constitutional: Negative for chills, diaphoresis, fatigue and fever.  HENT: Negative for congestion, rhinorrhea and sneezing.   Eyes: Negative.   Respiratory: Negative for cough, chest tightness and shortness of breath.   Cardiovascular: Negative for chest pain and leg swelling.  Gastrointestinal: Negative for abdominal pain, blood in stool, diarrhea, nausea and vomiting.  Genitourinary: Positive for vaginal discharge. Negative for difficulty urinating, flank pain, frequency, hematuria and vaginal bleeding.  Musculoskeletal: Negative for arthralgias and back pain.  Skin: Negative for rash.  Neurological: Negative for dizziness, speech difficulty, weakness, numbness and headaches.    Physical Exam Updated Vital Signs BP 132/86 (BP Location: Right Arm)   Pulse 90  Temp 98.3 F (36.8 C) (Oral)   Resp 16   Ht 5\' 3"  (1.6 m)   Wt 108.9 kg   LMP 07/26/2019   SpO2 100%   BMI 42.51 kg/m   Physical Exam Constitutional:      Appearance: She is well-developed.  HENT:     Head: Normocephalic and atraumatic.  Eyes:     Pupils: Pupils are equal, round, and reactive to light.  Cardiovascular:     Rate and Rhythm: Normal rate and regular rhythm.     Heart sounds: Normal heart sounds.  Pulmonary:     Effort: Pulmonary effort is normal. No respiratory distress.     Breath sounds: Normal breath sounds. No wheezing or rales.  Chest:     Chest wall: No tenderness.  Abdominal:     General: Bowel sounds are normal.     Palpations: Abdomen is soft.     Tenderness: There is no abdominal tenderness. There is no guarding or rebound.  Genitourinary:    Comments: Positive thin white discharge, no cervical motion tenderness, no adnexal  tenderness, no lesions noted Musculoskeletal:        General: Normal range of motion.     Cervical back: Normal range of motion and neck supple.  Lymphadenopathy:     Cervical: No cervical adenopathy.  Skin:    General: Skin is warm and dry.     Findings: No rash.  Neurological:     Mental Status: She is alert and oriented to person, place, and time.     ED Results / Procedures / Treatments   Labs (all labs ordered are listed, but only abnormal results are displayed) Labs Reviewed  WET PREP, GENITAL - Abnormal; Notable for the following components:      Result Value   WBC, Wet Prep HPF POC FEW (*)    All other components within normal limits  URINALYSIS, ROUTINE W REFLEX MICROSCOPIC - Abnormal; Notable for the following components:   Hgb urine dipstick TRACE (*)    Leukocytes,Ua SMALL (*)    All other components within normal limits  URINALYSIS, MICROSCOPIC (REFLEX) - Abnormal; Notable for the following components:   Bacteria, UA RARE (*)    All other components within normal limits  PREGNANCY, URINE  RPR  HIV ANTIBODY (ROUTINE TESTING W REFLEX)  GC/CHLAMYDIA PROBE AMP ([place]) NOT AT [facility]    EKG None  Radiology No results found.  Procedures Procedures (including critical care time)  Medications Ordered in ED Medications  cefTRIAXone (ROCEPHIN) injection 250 mg (has no administration in time range)  doxycycline (VIBRA-TABS) tablet 100 mg (has no administration in time range)    ED Course  I have reviewed the triage vital signs and the nursing notes.  Pertinent labs & imaging results that were available during my care of the patient were reviewed by me and considered in my medical decision making (see chart for details).    MDM Rules/Calculators/A&P                          Patient presents with vaginal discharge.  Wet prep is negative.  Pregnancy is negative.  She has no suggestions of UTI.  She was discharged home in good condition.  She was tested for  STDs and treated presumptively with Rocephin and doxycycline.  She was informed that if her STDs come back positive, her partner needs to be tested and treated as well. Final Clinical Impression(s) / ED Diagnoses  Final diagnoses:  Vaginal discharge    Rx / DC Orders ED Discharge Orders    None       Rolan Bucco, MD 09/08/19 2135

## 2019-09-08 NOTE — ED Triage Notes (Addendum)
Vaginal itching x 3 days. Self treated for yeast infection without relief. Now stating she is having vaginal discharge and would like to be checked for STDs

## 2019-09-09 LAB — HIV ANTIBODY (ROUTINE TESTING W REFLEX): HIV Screen 4th Generation wRfx: NONREACTIVE

## 2019-09-09 LAB — RPR: RPR Ser Ql: NONREACTIVE

## 2019-09-12 LAB — GC/CHLAMYDIA PROBE AMP (~~LOC~~) NOT AT ARMC
Chlamydia: NEGATIVE
Comment: NEGATIVE
Comment: NORMAL
Neisseria Gonorrhea: NEGATIVE

## 2019-09-26 ENCOUNTER — Ambulatory Visit (INDEPENDENT_AMBULATORY_CARE_PROVIDER_SITE_OTHER): Payer: 59 | Admitting: Family Medicine

## 2019-09-26 ENCOUNTER — Other Ambulatory Visit (HOSPITAL_COMMUNITY)
Admission: RE | Admit: 2019-09-26 | Discharge: 2019-09-26 | Disposition: A | Discharge status: Home / Self Care | Payer: 59 | Source: Ambulatory Visit | Attending: Family Medicine | Admitting: Family Medicine

## 2019-09-26 ENCOUNTER — Other Ambulatory Visit: Payer: Self-pay

## 2019-09-26 ENCOUNTER — Encounter: Payer: Self-pay | Admitting: Family Medicine

## 2019-09-26 VITALS — BP 92/62 | HR 70 | Ht 63.0 in | Wt 291.0 lb

## 2019-09-26 DIAGNOSIS — Z01419 Encounter for gynecological examination (general) (routine) without abnormal findings: Secondary | ICD-10-CM | POA: Diagnosis not present

## 2019-09-26 DIAGNOSIS — E282 Polycystic ovarian syndrome: Secondary | ICD-10-CM

## 2019-09-26 MED ORDER — NORETHIN ACE-ETH ESTRAD-FE 1-20 MG-MCG(24) PO TABS: 1.0000 | ORAL_TABLET | Freq: Every day | ORAL | 3 refills | Status: AC

## 2019-09-26 MED FILL — BLISOVI 24 FE 1-20 MG-MCG(2: 1-20 | 84 days supply | Qty: 84 | Fill #0

## 2019-09-26 NOTE — Progress Notes (Signed)
GYNECOLOGY ANNUAL PREVENTATIVE CARE ENCOUNTER NOTE  Subjective:   Lisa Franklin is a 24 y.o. G0P0000 female here for a routine annual gynecologic exam.  Current complaints: irregular periods - goes months without periods. Worsened with weight gain. Was on OCPs for a few months when first diagnosed at age 34. Denies abnormal vaginal bleeding, discharge, pelvic pain, problems with intercourse or other gynecologic concerns.    Gynecologic History No LMP recorded. (Menstrual status: Oral contraceptives). Patient is occasionally sexually active  Contraception: none Last Pap: n/a. Last mammogram: n/a.  Obstetric History OB History  Gravida Para Term Preterm AB Living  0 0 0 0 0 0  SAB TAB Ectopic Multiple Live Births  0 0 0 0 0    Past Medical History:  Diagnosis Date  . Eczema   . PCOS (polycystic ovarian syndrome)     Past Surgical History:  Procedure Laterality Date  . Unremarkable      No current outpatient medications on file prior to visit.   No current facility-administered medications on file prior to visit.    No Known Allergies  Social History   Socioeconomic History  . Marital status: Single    Spouse name: Not on file  . Number of children: Not on file  . Years of education: Not on file  . Highest education level: Not on file  Occupational History  . Not on file  Tobacco Use  . Smoking status: Never Smoker  . Smokeless tobacco: Never Used  Substance and Sexual Activity  . Alcohol use: No  . Drug use: No  . Sexual activity: Not Currently    Birth control/protection: None  Other Topics Concern  . Not on file  Social History Narrative  . Not on file   Social Determinants of Health   Financial Resource Strain:   . Difficulty of Paying Living Expenses:   Food Insecurity:   . Worried About Programme researcher, broadcasting/film/video in the Last Year:   . Barista in the Last Year:   Transportation Needs:   . Freight forwarder (Medical):   Marland Kitchen Lack of  Transportation (Non-Medical):   Physical Activity:   . Days of Exercise per Week:   . Minutes of Exercise per Session:   Stress:   . Feeling of Stress :   Social Connections:   . Frequency of Communication with Friends and Family:   . Frequency of Social Gatherings with Friends and Family:   . Attends Religious Services:   . Active Member of Clubs or Organizations:   . Attends Banker Meetings:   Marland Kitchen Marital Status:   Intimate Partner Violence:   . Fear of Current or Ex-Partner:   . Emotionally Abused:   Marland Kitchen Physically Abused:   . Sexually Abused:     Family History  Problem Relation Age of Onset  . Hypertension Mother        Living  . Hyperlipidemia Mother   . Cancer - Other Mother        Endometrium  . GER disease Mother   . Hypertension Father        living  . Diabetes Maternal Grandmother   . Hypertension Maternal Grandmother   . Hyperlipidemia Maternal Grandmother   . Heart disease Maternal Grandfather   . Hypertension Maternal Grandfather   . Diabetes Maternal Aunt        x3  . Heart disease Maternal Aunt        x1  .  Hypertension Maternal Aunt        x3  . Asthma Sister     The following portions of the patient's history were reviewed and updated as appropriate: allergies, current medications, past family history, past medical history, past social history, past surgical history and problem list.  Review of Systems Pertinent items are noted in HPI.   Objective:  BP 92/62   Pulse 70   Ht 5\' 3"  (1.6 m)   Wt 291 lb (132 kg)   BMI 51.55 kg/m  Wt Readings from Last 3 Encounters:  09/26/19 291 lb (132 kg)  09/08/19 240 lb (108.9 kg)  02/09/19 260 lb (117.9 kg)     Chaperone present during exam  CONSTITUTIONAL: Well-developed, well-nourished female in no acute distress.  HENT:  Normocephalic, atraumatic, External right and left ear normal. Oropharynx is clear and moist EYES: Conjunctivae and EOM are normal. Pupils are equal, round, and  reactive to light. No scleral icterus.  NECK: Normal range of motion, supple, no masses.  Normal thyroid.   CARDIOVASCULAR: Normal heart rate noted, regular rhythm RESPIRATORY: Clear to auscultation bilaterally. Effort and breath sounds normal, no problems with respiration noted. BREASTS: Symmetric in size. No masses, skin changes, nipple drainage, or lymphadenopathy. ABDOMEN: Soft, normal bowel sounds, no distention noted.  No tenderness, rebound or guarding.  PELVIC: Normal appearing external genitalia; normal appearing vaginal mucosa and cervix.  No abnormal discharge noted.  Normal uterine size, no other palpable masses, no uterine or adnexal tenderness. MUSCULOSKELETAL: Normal range of motion. No tenderness.  No cyanosis, clubbing, or edema.  2+ distal pulses. SKIN: Skin is warm and dry. No rash noted. Not diaphoretic. No erythema. No pallor. NEUROLOGIC: Alert and oriented to person, place, and time. Normal reflexes, muscle tone coordination. No cranial nerve deficit noted. PSYCHIATRIC: Normal mood and affect. Normal behavior. Normal judgment and thought content.  Assessment:  Annual gynecologic examination with pap smear   Plan:  1. Well Woman Exam Will follow up results of pap smear and manage accordingly. - Cytology - PAP( [facility])  2. PCOS (polycystic ovarian syndrome) Discussed weight loss and OCPs. Patient wants to restart OCPs. loestrin prescribed.   Routine preventative health maintenance measures emphasized. Please refer to After Visit Summary for other counseling recommendations.    02/11/19, DO [facility]

## 2019-09-27 LAB — CYTOLOGY - PAP: Diagnosis: NEGATIVE

## 2019-10-13 ENCOUNTER — Emergency Department (HOSPITAL_BASED_OUTPATIENT_CLINIC_OR_DEPARTMENT_OTHER): Payer: 59

## 2019-10-13 ENCOUNTER — Encounter (HOSPITAL_BASED_OUTPATIENT_CLINIC_OR_DEPARTMENT_OTHER): Payer: Self-pay | Admitting: Emergency Medicine

## 2019-10-13 ENCOUNTER — Emergency Department (HOSPITAL_BASED_OUTPATIENT_CLINIC_OR_DEPARTMENT_OTHER)
Admission: EM | Admit: 2019-10-13 | Discharge: 2019-10-13 | Disposition: A | Discharge status: Home / Self Care | Payer: 59 | Attending: Emergency Medicine | Admitting: Emergency Medicine

## 2019-10-13 ENCOUNTER — Other Ambulatory Visit: Payer: Self-pay

## 2019-10-13 DIAGNOSIS — R5084 Febrile nonhemolytic transfusion reaction: Secondary | ICD-10-CM | POA: Diagnosis not present

## 2019-10-13 DIAGNOSIS — N10 Acute tubulo-interstitial nephritis: Secondary | ICD-10-CM | POA: Diagnosis not present

## 2019-10-13 DIAGNOSIS — I880 Nonspecific mesenteric lymphadenitis: Secondary | ICD-10-CM | POA: Diagnosis not present

## 2019-10-13 DIAGNOSIS — N200 Calculus of kidney: Secondary | ICD-10-CM | POA: Diagnosis not present

## 2019-10-13 DIAGNOSIS — R109 Unspecified abdominal pain: Secondary | ICD-10-CM | POA: Diagnosis present

## 2019-10-13 DIAGNOSIS — R310 Gross hematuria: Secondary | ICD-10-CM | POA: Insufficient documentation

## 2019-10-13 DIAGNOSIS — N23 Unspecified renal colic: Secondary | ICD-10-CM | POA: Insufficient documentation

## 2019-10-13 DIAGNOSIS — N1330 Unspecified hydronephrosis: Secondary | ICD-10-CM | POA: Diagnosis not present

## 2019-10-13 LAB — URINALYSIS, MICROSCOPIC (REFLEX): RBC / HPF: >50 RBC/hpf (ref 0–5)

## 2019-10-13 LAB — URINALYSIS, ROUTINE W REFLEX MICROSCOPIC
Glucose, UA: NEGATIVE mg/dL
Ketones, ur: NEGATIVE mg/dL
Nitrite: NEGATIVE
Protein, ur: 100 mg/dL — AB
Specific Gravity, Urine: >1.03 — ABNORMAL HIGH (ref 1.005–1.030)
pH: 5.5 (ref 5.0–8.0)

## 2019-10-13 LAB — PREGNANCY, URINE: Preg Test, Ur: NEGATIVE

## 2019-10-13 NOTE — Discharge Instructions (Signed)
You probably passed a kidney stone from your kidney out of your bladder this morning.  That would explain the sudden onset of severe pain in your left flank which then improved.  It would also explain why there is blood in your urine.  You should continue to improve.  There are still stones in your kidneys so you may pass another kidney stone in the future.

## 2019-10-13 NOTE — ED Triage Notes (Signed)
  Patient comes in with L flank pain that started about an hour ago.  Patient states she woke up about and hour ago and had the urge to pee but could not void.  Patient states when some urine did come out it burned "really bad".  No hx kidney stones.  No nausea or vomiting.

## 2019-10-13 NOTE — ED Provider Notes (Signed)
[facility] Provider Note: Lowella Dell, MD, FACEP  CSN: [id number] MRN: [id number] ARRIVAL: 10/13/19 at 0427 ROOM: [facility]03/[facility]03   CHIEF COMPLAINT  Flank Pain   HISTORY OF PRESENT ILLNESS  10/13/19 5:12 AM Lisa Franklin is a 24 y.o. female with left flank pain that started about an hour ago.  This is been associated with the urge to urinate but she found it difficult to void.  When she did void it burned "really bad".  She has had no nausea or vomiting with this.  She is afebrile.  She rates her pain is a 5 out of 10, sharp in nature, not made better or worse with position or movement.   Past Medical History:  Diagnosis Date  . Eczema   . PCOS (polycystic ovarian syndrome)     Past Surgical History:  Procedure Laterality Date  . Unremarkable      Family History  Problem Relation Age of Onset  . Hypertension Mother        Living  . Hyperlipidemia Mother   . Cancer - Other Mother        Endometrium  . GER disease Mother   . Hypertension Father        living  . Diabetes Maternal Grandmother   . Hypertension Maternal Grandmother   . Hyperlipidemia Maternal Grandmother   . Heart disease Maternal Grandfather   . Hypertension Maternal Grandfather   . Diabetes Maternal Aunt        x3  . Heart disease Maternal Aunt        x1  . Hypertension Maternal Aunt        x3  . Asthma Sister     Social History   Tobacco Use  . Smoking status: Never Smoker  . Smokeless tobacco: Never Used  Substance Use Topics  . Alcohol use: No  . Drug use: No    Prior to Admission medications   Medication Sig Start Date End Date Taking? Authorizing Provider  Norethindrone Acetate-Ethinyl Estrad-FE (LOESTRIN 24 FE) 1-20 MG-MCG(24) tablet Take 1 tablet by mouth daily. 09/26/19   Levie Heritage, DO    Allergies Patient has no known allergies.   REVIEW OF SYSTEMS  Negative except as noted here or in the History of Present Illness.   PHYSICAL EXAMINATION  Initial  Vital Signs Blood pressure 117/79, pulse 71, temperature 98.3 F (36.8 C), temperature source Oral, resp. rate 18, height 5\' 3"  (1.6 m), weight (!) 132 kg, SpO2 99 %.  Examination General: Well-developed, well-nourished female in no acute distress; appearance consistent with age of record HENT: normocephalic; atraumatic Eyes: Normal appearance Neck: supple Heart: regular rate and rhythm Lungs: clear to auscultation bilaterally Abdomen: soft; nondistended; nontender; bowel sounds present GU: No CVA tenderness Extremities: No deformity; full range of motion; pulses normal Neurologic: Awake, alert and oriented; motor function intact in all extremities and symmetric; no facial droop Skin: Warm and dry Psychiatric: Normal mood and affect   RESULTS  Summary of this visit's results, reviewed and interpreted by myself:   EKG Interpretation  Date/Time:    Ventricular Rate:    PR Interval:    QRS Duration:   QT Interval:    QTC Calculation:   R Axis:     Text Interpretation:        Laboratory Studies: Results for orders placed or performed during the hospital encounter of 10/13/19 (from the past 24 hour(s))  Urinalysis, Routine w reflex microscopic- may I&O cath if menses  Status: Abnormal   Collection Time: 10/13/19  4:51 AM  Result Value Ref Range   Color, Urine ORANGE (A) YELLOW   APPearance CLOUDY (A) CLEAR   Specific Gravity, Urine >1.030 (H) 1.005 - 1.030   pH 5.5 5.0 - 8.0   Glucose, UA NEGATIVE NEGATIVE mg/dL   Hgb urine dipstick LARGE (A) NEGATIVE   Bilirubin Urine SMALL (A) NEGATIVE   Ketones, ur NEGATIVE NEGATIVE mg/dL   Protein, ur 893 (A) NEGATIVE mg/dL   Nitrite NEGATIVE NEGATIVE   Leukocytes,Ua TRACE (A) NEGATIVE  Pregnancy, urine     Status: None   Collection Time: 10/13/19  4:51 AM  Result Value Ref Range   Preg Test, Ur NEGATIVE NEGATIVE  Urinalysis, Microscopic (reflex)     Status: Abnormal   Collection Time: 10/13/19  4:51 AM  Result Value Ref  Range   RBC / HPF >50 0 - 5 RBC/hpf   WBC, UA 0-5 0 - 5 WBC/hpf   Bacteria, UA MANY (A) NONE SEEN   Squamous Epithelial / LPF 11-20 0 - 5   Imaging Studies: CT Renal Stone Study  Result Date: 10/13/2019 CLINICAL DATA:  Left flank pain EXAM: CT ABDOMEN AND PELVIS WITHOUT CONTRAST TECHNIQUE: Multidetector CT imaging of the abdomen and pelvis was performed following the standard protocol without oral or IV contrast. COMPARISON:  None. FINDINGS: Lower chest: Lung bases are clear. Hepatobiliary: No focal liver lesions are appreciable on this noncontrast enhanced study. Gallbladder wall is not appreciably thickened. There is no evident biliary duct dilatation. Pancreas: No pancreatic mass or inflammatory focus. Spleen: No splenic lesions are evident. Adrenals/Urinary Tract: Adrenals bilaterally appear normal. There is no evident renal mass on either side. There is minimal hydronephrosis on the left. There is a 1 mm calculus in the mid left kidney. There is a 1 mm calculus in the lower pole of the left kidney. There is a 2 mm calculus in the upper pole of the left kidney. There is no appreciable ureteral calculus on either side. Urinary bladder is midline. The urinary bladder wall is felt to be borderline thickened. Stomach/Bowel: There is no appreciable bowel wall or mesenteric thickening. The terminal ileum appears normal. There is no evident bowel obstruction. There is no free air or portal venous air. Vascular/Lymphatic: No abdominal aortic aneurysm. No arterial vascular lesions are evident on this noncontrast enhanced study. There is no appreciable adenopathy in the abdomen or pelvis by size criteria, there are scattered mesenteric lymph nodes, primarily on the right side. Reproductive: The uterus is anteverted.  No evident pelvic mass. Other: The appendix appears normal. There is no evident ascites or abscess in the abdomen or pelvis. There is mild fat in the umbilicus. Musculoskeletal: No blastic or lytic  bone lesions. No intramuscular lesions are evident. IMPRESSION: 1. Minimal hydronephrosis on the left. There are 1-2 mm nonobstructing calculi in the left kidney. There is no ureteral calculus on the left. Question recent calculus passage on the left. Early pyelonephritis is a differential consideration for this appearance. No evidence of perinephric fluid or renal abscess on this noncontrast enhanced study. 2. Urinary bladder wall mildly thickened. Suspect a degree of cystitis. 3. No bowel wall thickening or bowel obstruction. No abscess in the abdomen or pelvis. Appendix appears normal. 4. Subcentimeter mesenteric lymph nodes are considered nonspecific. In the appropriate clinical setting, a degree of mesenteric adenitis could present in this manner. No adenopathy by size criteria evident. Electronically Signed   By: Bretta Bang III M.D.  On: 10/13/2019 05:47    ED COURSE and MDM  Nursing notes, initial and subsequent vitals signs, including pulse oximetry, reviewed and interpreted by myself.  Vitals:   10/13/19 0449 10/13/19 0450  BP: 117/79   Pulse: 71   Resp: 18   Temp: 98.3 F (36.8 C)   TempSrc: Oral   SpO2: 99%   Weight:  (!) 132 kg  Height:  5\' 3"  (1.6 m)   Medications - No data to display  5:54 AM History, urinalysis and CT are consistent with a recently passed kidney stone.  This would explain the sudden onset of symptoms earlier this morning, their initial severity and subsequent improvement.  She is essentially pain-free now.  Urine has been sent for culture in case of early pyelonephritis but her clinical presentation is not consistent with pyelonephritis.  PROCEDURES  Procedures   ED DIAGNOSES     ICD-10-CM   1. Ureteral colic  N23   2. Gross hematuria  R31.0        Rainn Zupko, , MD 10/13/19 [phone]

## 2019-10-15 LAB — URINE CULTURE: Culture: >=100000 — AB

## 2019-10-16 ENCOUNTER — Telehealth (HOSPITAL_BASED_OUTPATIENT_CLINIC_OR_DEPARTMENT_OTHER): Payer: Self-pay | Admitting: Emergency Medicine

## 2019-10-16 NOTE — Telephone Encounter (Signed)
Post ED Visit - Positive Culture Follow-up: Unsuccessful Patient Follow-up  Culture assessed and recommendations reviewed by:  []  , Pharm.D. []  Enzo Bi, Pharm.D., BCPS AQ-ID []  , Pharm.D., BCPS []  Celedonio Miyamoto, Pharm.D., BCPS []  [place], Garvin Fila.D., BCPS, AAHIVP []  , Pharm.D., BCPS, AAHIVP []  Georgina Pillion, PharmD []  , PharmD, BCPS  Positive urine culture  [x]  Patient discharged without antimicrobial prescription and treatment is now indicated []  Organism is resistant to prescribed ED discharge antimicrobial []  Patient with positive blood cultures   Unable to contact patient at phone number on file, letter will be sent to address on file.  Plan: Bactrim DS one table twice daily for 10 days. Shawn Joy PA   [place] 10/16/2019, 6:24 PM

## 2019-10-26 ENCOUNTER — Telehealth: Payer: Self-pay | Admitting: Emergency Medicine

## 2019-10-26 MED FILL — SULFAMETHOXAZOLE-TMP DS TAB: 800-160 | 10 days supply | Qty: 20 | Fill #0

## 2020-06-12 ENCOUNTER — Telehealth: Payer: Self-pay | Admitting: Family Medicine

## 2020-06-12 NOTE — Telephone Encounter (Signed)
Called left detailed message to call back to schedule a CPE.

## 2020-07-19 ENCOUNTER — Other Ambulatory Visit (HOSPITAL_BASED_OUTPATIENT_CLINIC_OR_DEPARTMENT_OTHER): Payer: Self-pay

## 2020-07-19 MED ORDER — BLISOVI 24 FE 1-20 MG-MCG(24) PO TABS
1.0000 | ORAL_TABLET | Freq: Every day | ORAL | 2 refills | Status: DC
  Filled 2020-07-19: qty 84, 84d supply, fill #0

## 2020-10-16 ENCOUNTER — Other Ambulatory Visit (HOSPITAL_BASED_OUTPATIENT_CLINIC_OR_DEPARTMENT_OTHER): Payer: Self-pay

## 2020-10-16 ENCOUNTER — Other Ambulatory Visit: Payer: Self-pay | Admitting: Family Medicine

## 2020-10-16 MED ORDER — BLISOVI 24 FE 1-20 MG-MCG(24) PO TABS
1.0000 | ORAL_TABLET | Freq: Every day | ORAL | 2 refills | Status: AC
  Filled 2020-10-16: qty 84, 84d supply, fill #0

## 2020-11-07 ENCOUNTER — Other Ambulatory Visit: Payer: Self-pay

## 2020-11-07 ENCOUNTER — Emergency Department (HOSPITAL_BASED_OUTPATIENT_CLINIC_OR_DEPARTMENT_OTHER): Payer: 59

## 2020-11-07 ENCOUNTER — Other Ambulatory Visit (HOSPITAL_BASED_OUTPATIENT_CLINIC_OR_DEPARTMENT_OTHER): Payer: Self-pay

## 2020-11-07 ENCOUNTER — Encounter (HOSPITAL_BASED_OUTPATIENT_CLINIC_OR_DEPARTMENT_OTHER): Payer: Self-pay | Admitting: Emergency Medicine

## 2020-11-07 ENCOUNTER — Emergency Department (HOSPITAL_BASED_OUTPATIENT_CLINIC_OR_DEPARTMENT_OTHER)
Admission: EM | Admit: 2020-11-07 | Discharge: 2020-11-07 | Disposition: A | Discharge status: Home / Self Care | Payer: 59 | Attending: Emergency Medicine | Admitting: Emergency Medicine

## 2020-11-07 DIAGNOSIS — R109 Unspecified abdominal pain: Secondary | ICD-10-CM | POA: Diagnosis present

## 2020-11-07 DIAGNOSIS — N1330 Unspecified hydronephrosis: Secondary | ICD-10-CM | POA: Diagnosis not present

## 2020-11-07 DIAGNOSIS — N201 Calculus of ureter: Secondary | ICD-10-CM

## 2020-11-07 DIAGNOSIS — F17210 Nicotine dependence, cigarettes, uncomplicated: Secondary | ICD-10-CM | POA: Insufficient documentation

## 2020-11-07 DIAGNOSIS — N200 Calculus of kidney: Secondary | ICD-10-CM | POA: Insufficient documentation

## 2020-11-07 DIAGNOSIS — R197 Diarrhea, unspecified: Secondary | ICD-10-CM | POA: Diagnosis not present

## 2020-11-07 LAB — COMPREHENSIVE METABOLIC PANEL
ALT: 14 U/L (ref 0–44)
AST: 15 U/L (ref 15–41)
Albumin: 4.2 g/dL (ref 3.5–5.0)
Alkaline Phosphatase: 31 U/L — ABNORMAL LOW (ref 38–126)
Anion gap: 9 (ref 5–15)
BUN: 9 mg/dL (ref 6–20)
CO2: 22 mmol/L (ref 22–32)
Calcium: 9.1 mg/dL (ref 8.9–10.3)
Chloride: 106 mmol/L (ref 98–111)
Creatinine, Ser: 0.93 mg/dL (ref 0.44–1.00)
GFR, Estimated: >60 mL/min (ref >60)
Glucose, Bld: 131 mg/dL — ABNORMAL HIGH (ref 70–99)
Potassium: 4.1 mmol/L (ref 3.5–5.1)
Sodium: 137 mmol/L (ref 135–145)
Total Bilirubin: 0.4 mg/dL (ref 0.3–1.2)
Total Protein: 8.1 g/dL (ref 6.5–8.1)

## 2020-11-07 LAB — CBC WITH DIFFERENTIAL/PLATELET
Abs Immature Granulocytes: 0.03 10*3/uL (ref 0.00–0.07)
Basophils Absolute: 0.1 10*3/uL (ref 0.0–0.1)
Basophils Relative: 1 %
Eosinophils Absolute: 0 10*3/uL (ref 0.0–0.5)
Eosinophils Relative: 0 %
HCT: 37.7 % (ref 36.0–46.0)
Hemoglobin: 12.6 g/dL (ref 12.0–15.0)
Immature Granulocytes: 0 %
Lymphocytes Relative: 12 %
Lymphs Abs: 1.4 10*3/uL (ref 0.7–4.0)
MCH: 30.7 pg (ref 26.0–34.0)
MCHC: 33.4 g/dL (ref 30.0–36.0)
MCV: 92 fL (ref 80.0–100.0)
Monocytes Absolute: 0.3 10*3/uL (ref 0.1–1.0)
Monocytes Relative: 3 %
Neutro Abs: 9.3 10*3/uL — ABNORMAL HIGH (ref 1.7–7.7)
Neutrophils Relative %: 84 %
Platelets: 396 10*3/uL (ref 150–400)
RBC: 4.1 MIL/uL (ref 3.87–5.11)
RDW: 12.9 % (ref 11.5–15.5)
WBC: 11.1 10*3/uL — ABNORMAL HIGH (ref 4.0–10.5)
nRBC: 0 % (ref 0.0–0.2)

## 2020-11-07 LAB — URINALYSIS, ROUTINE W REFLEX MICROSCOPIC
Bilirubin Urine: NEGATIVE
Glucose, UA: NEGATIVE mg/dL
Ketones, ur: NEGATIVE mg/dL
Leukocytes,Ua: NEGATIVE
Nitrite: NEGATIVE
Protein, ur: NEGATIVE mg/dL
Specific Gravity, Urine: 1.03 (ref 1.005–1.030)
pH: 6 (ref 5.0–8.0)

## 2020-11-07 LAB — URINALYSIS, MICROSCOPIC (REFLEX): RBC / HPF: >50 RBC/hpf (ref 0–5)

## 2020-11-07 LAB — LIPASE, BLOOD: Lipase: 22 U/L (ref 11–51)

## 2020-11-07 LAB — PREGNANCY, URINE: Preg Test, Ur: NEGATIVE

## 2020-11-07 MED ORDER — ONDANSETRON 4 MG PO TBDP
4.0000 mg | ORAL_TABLET | Freq: Three times a day (TID) | ORAL | 0 refills | Status: AC | PRN
  Filled 2020-11-07: qty 20, 7d supply, fill #0

## 2020-11-07 MED ORDER — OXYCODONE-ACETAMINOPHEN 5-325 MG PO TABS
1.0000 | ORAL_TABLET | Freq: Four times a day (QID) | ORAL | 0 refills | Status: AC | PRN
  Filled 2020-11-07: qty 10, 3d supply, fill #0

## 2020-11-07 MED ORDER — SODIUM CHLORIDE 0.9 % IV BOLUS
1000.0000 mL | Freq: Once | INTRAVENOUS | Status: AC
  Administered 2020-11-07: 1000 mL via INTRAVENOUS

## 2020-11-07 MED ORDER — ONDANSETRON HCL 4 MG/2ML IJ SOLN
4.0000 mg | Freq: Once | INTRAMUSCULAR | Status: AC
  Administered 2020-11-07: 4 mg via INTRAVENOUS
  Filled 2020-11-07: qty 2

## 2020-11-07 MED ORDER — MORPHINE SULFATE (PF) 4 MG/ML IV SOLN
4.0000 mg | Freq: Once | INTRAVENOUS | Status: AC
Start: 2020-11-07 — End: 2020-11-07
  Administered 2020-11-07: 4 mg via INTRAVENOUS
  Filled 2020-11-07: qty 1

## 2020-11-07 MED ORDER — KETOROLAC TROMETHAMINE 15 MG/ML IJ SOLN
15.0000 mg | Freq: Once | INTRAMUSCULAR | Status: AC
  Administered 2020-11-07: 15 mg via INTRAVENOUS
  Filled 2020-11-07: qty 1

## 2020-11-07 MED ORDER — TAMSULOSIN HCL 0.4 MG PO CAPS
0.4000 mg | ORAL_CAPSULE | Freq: Every day | ORAL | 0 refills | Status: AC
  Filled 2020-11-07: qty 30, 30d supply, fill #0

## 2020-11-07 NOTE — ED Notes (Signed)
Pt denies diarrhea or constipation, denies dysuria

## 2020-11-07 NOTE — ED Notes (Signed)
ED Provider at bedside to update on results

## 2020-11-07 NOTE — ED Notes (Signed)
Pt discharged to home. Discharge instructions have been discussed with patient and/or family members. Pt verbally acknowledges understanding d/c instructions, and endorses comprehension to checkout at registration before leaving.  °

## 2020-11-07 NOTE — ED Notes (Signed)
ED Provider at bedside. 

## 2020-11-07 NOTE — Discharge Instructions (Addendum)
You have a kidney stone.  You should follow-up with a urologist.  Drink plenty of fluids, take the Flomax as prescribed.  For nausea take Zofran as needed.  For pain take Tylenol and anti-inflammatory such as Motrin or naproxen.  For breakthrough pain can take the prescribed Percocet.  Please note this has some Tylenol and can make you mildly drowsy and should not be taken while driving or operating machinery.

## 2020-11-07 NOTE — ED Provider Notes (Addendum)
Signout note  25 year old lady presenting to ER with concern for left-sided abdominal pain.  Basic labs, UA, CT scan ordered.  Pain meds ordered.  If CT negative and pain well controlled anticipate discharge.  7:00 AM received signout from Dr. Madilyn Hook  7:48 AM rechecked patient, she has no ongoing pain and denies any other ongoing symptoms, will await CT scan report  8:01 AM  IMPRESSION: 1. 4 mm obstructive calculus in the middle third of the left ureter. Mild proximal left hydroureteronephrosis and left sided perinephric soft tissue stranding. 2. Multiple additional nonobstructive calculi measuring 2-3 mm in the collecting systems of both kidneys (left greater than right).   CT concerning for 4 mm stone in the left ureter.  Suspect this is culprit for her pain today.  No fever.  No UTI.  Recommend supportive care, Flomax, follow-up with urology.  Will review return precautions with patient and discharge.    Milagros Loll, MD 11/07/20 [phone]

## 2020-11-07 NOTE — ED Triage Notes (Signed)
Pt is c/o abd pain with vomiting  Pt states she started vomiting tonight around midnight and has been having abd pain all over her abdomen  Pt states she is on her period as well  Has had some diarrhea

## 2020-11-07 NOTE — ED Provider Notes (Signed)
[facility] EMERGENCY DEPARTMENT Provider Note   CSN: [id number] Arrival date & time: 11/07/20  0500     History Chief Complaint  Patient presents with   Abdominal Pain    Lisa Franklin is a 25 y.o. female.  The history is provided by the patient and medical records.  Abdominal Pain Lisa Franklin is a 25 y.o. female who presents to the Emergency Department complaining of abdominal pain. She presents to the ED complaining of left sided mid abdominal pain that is described as sharp in nature, nonradiating. Pain started around 10 PM last night. Has associated significant nausea/vomiting.  Has associated diarrhea - 3-4 BMs.  No dysuria.  No fever.    Did eat chick fil a yesterday.  No chance of pregnancy.  No new sexual partners.  No vaginal discharge.    Has a hx/o PCOS.  Takes birth control. Sxs are severe and constant. She did have kidney stone about a year ago that had some similarities that this current pain but was also somewhat different.    Past Medical History:  Diagnosis Date   Eczema    PCOS (polycystic ovarian syndrome)     Patient Active Problem List   Diagnosis Date Noted   Eczema 04/03/2015   PCOS (polycystic ovarian syndrome) 10/13/2013   Acanthosis nigricans 10/06/2013    Past Surgical History:  Procedure Laterality Date   Unremarkable       OB History     Gravida  0   Para  0   Term  0   Preterm  0   AB  0   Living  0      SAB  0   IAB  0   Ectopic  0   Multiple  0   Live Births  0           Family History  Problem Relation Age of Onset   Hypertension Mother        Living   Hyperlipidemia Mother    Cancer - Other Mother        Endometrium   GER disease Mother    Hypertension Father        living   Diabetes Maternal Grandmother    Hypertension Maternal Grandmother    Hyperlipidemia Maternal Grandmother    Heart disease Maternal Grandfather    Hypertension Maternal Grandfather    Diabetes Maternal Aunt         x3   Heart disease Maternal Aunt        x1   Hypertension Maternal Aunt        x3   Asthma Sister     Social History   Tobacco Use   Smoking status: Every Day    Types: Cigarettes   Smokeless tobacco: Never  Vaping Use   Vaping Use: Never used  Substance Use Topics   Alcohol use: No   Drug use: No    Home Medications Prior to Admission medications   Medication Sig Start Date End Date Taking? Authorizing Provider  Norethindrone Acetate-Ethinyl Estrad-FE (BLISOVI 24 FE) 1-20 MG-MCG(24) tablet Take 1 tablet by mouth daily. 10/16/20   Reva Bores, MD  Norethindrone Acetate-Ethinyl Estrad-FE (LOESTRIN 24 FE) 1-20 MG-MCG(24) tablet Take 1 tablet by mouth daily. 09/26/19   Levie Heritage, DO    Allergies    Patient has no known allergies.  Review of Systems   Review of Systems  Gastrointestinal:  Positive for abdominal pain.  All other  systems reviewed and are negative.  Physical Exam Updated Vital Signs BP 133/81 (BP Location: Right Arm)   Pulse 82   Temp 98.6 F (37 C) (Oral)   Resp 16   Ht 5\' 3"  (1.6 m)   Wt 114.8 kg   LMP 11/03/2020 (Exact Date)   SpO2 99%   BMI 44.82 kg/m   Physical Exam Vitals and nursing note reviewed.  Constitutional:      Appearance: She is well-developed.  HENT:     Head: Normocephalic and atraumatic.  Cardiovascular:     Rate and Rhythm: Normal rate and regular rhythm.  Pulmonary:     Effort: Pulmonary effort is normal. No respiratory distress.  Abdominal:     Palpations: Abdomen is soft.     Tenderness: There is no abdominal tenderness. There is no guarding or rebound.  Musculoskeletal:        General: No tenderness.  Skin:    General: Skin is warm and dry.  Neurological:     Mental Status: She is alert and oriented to person, place, and time.  Psychiatric:        Behavior: Behavior normal.    ED Results / Procedures / Treatments   Labs (all labs ordered are listed, but only abnormal results are displayed) Labs  Reviewed  COMPREHENSIVE METABOLIC PANEL - Abnormal; Notable for the following components:      Result Value   Glucose, Bld 131 (*)    Alkaline Phosphatase 31 (*)    All other components within normal limits  CBC WITH DIFFERENTIAL/PLATELET - Abnormal; Notable for the following components:   WBC 11.1 (*)    Neutro Abs 9.3 (*)    All other components within normal limits  LIPASE, BLOOD  URINALYSIS, ROUTINE W REFLEX MICROSCOPIC  PREGNANCY, URINE    EKG None  Radiology No results found.  Procedures Procedures   Medications Ordered in ED Medications  morphine 4 MG/ML injection 4 mg (4 mg Intravenous Given 11/07/20 0542)  ondansetron (ZOFRAN) injection 4 mg (4 mg Intravenous Given 11/07/20 0541)  sodium chloride 0.9 % bolus 1,000 mL (1,000 mLs Intravenous New Bag/Given 11/07/20 0541)    ED Course  I have reviewed the triage vital signs and the nursing notes.  Pertinent labs & imaging results that were available during my care of the patient were reviewed by me and considered in my medical decision making (see chart for details).    MDM Rules/Calculators/A&P                          patient here for evaluation of left-sided abdominal pain, vomiting and diarrhea since last night. She does have a history of PC OS as well as kidney stones. Labs with minimal leukocytosis. Plan to treat her symptoms and obtain CT scan to rule out kidney stone. Patient care transferred pending imaging and reassessment.  Final Clinical Impression(s) / ED Diagnoses Final diagnoses:  None    Rx / DC Orders ED Discharge Orders     None        11/09/20, MD 11/07/20 [phone]

## 2020-11-09 ENCOUNTER — Other Ambulatory Visit (HOSPITAL_BASED_OUTPATIENT_CLINIC_OR_DEPARTMENT_OTHER): Payer: Self-pay

## 2020-11-09 ENCOUNTER — Telehealth: Payer: Self-pay

## 2020-11-09 ENCOUNTER — Telehealth (HOSPITAL_BASED_OUTPATIENT_CLINIC_OR_DEPARTMENT_OTHER): Payer: Self-pay | Admitting: Emergency Medicine

## 2020-11-09 DIAGNOSIS — R8271 Bacteriuria: Secondary | ICD-10-CM | POA: Diagnosis not present

## 2020-11-09 DIAGNOSIS — N201 Calculus of ureter: Secondary | ICD-10-CM | POA: Diagnosis not present

## 2020-11-09 DIAGNOSIS — R1084 Generalized abdominal pain: Secondary | ICD-10-CM | POA: Diagnosis not present

## 2020-11-09 MED ORDER — OXYCODONE-ACETAMINOPHEN 5-325 MG PO TABS
ORAL_TABLET | ORAL | 0 refills | Status: AC
  Filled 2020-11-09: qty 20, 4d supply, fill #0

## 2020-11-09 NOTE — Telephone Encounter (Signed)
Pt was seen in [facility] ED and called [place] with c/o vomiting and pain. She was told at discharge to f/u with our office. I have spoken to the nurse at [facility] ED who d/c her and she is to f/u with Dr. Laverle Patter at [facility] urology. I have given pt this info and the urology office contact number. She verbalized understanding.

## 2020-11-10 ENCOUNTER — Other Ambulatory Visit: Payer: Self-pay

## 2020-11-10 ENCOUNTER — Emergency Department (HOSPITAL_BASED_OUTPATIENT_CLINIC_OR_DEPARTMENT_OTHER)
Admission: EM | Admit: 2020-11-10 | Discharge: 2020-11-10 | Disposition: A | Discharge status: Home / Self Care | Payer: 59 | Attending: Emergency Medicine | Admitting: Emergency Medicine

## 2020-11-10 ENCOUNTER — Encounter (HOSPITAL_BASED_OUTPATIENT_CLINIC_OR_DEPARTMENT_OTHER): Payer: Self-pay | Admitting: Emergency Medicine

## 2020-11-10 ENCOUNTER — Emergency Department (HOSPITAL_BASED_OUTPATIENT_CLINIC_OR_DEPARTMENT_OTHER): Payer: 59

## 2020-11-10 DIAGNOSIS — N132 Hydronephrosis with renal and ureteral calculous obstruction: Secondary | ICD-10-CM | POA: Diagnosis not present

## 2020-11-10 DIAGNOSIS — Z87891 Personal history of nicotine dependence: Secondary | ICD-10-CM | POA: Insufficient documentation

## 2020-11-10 DIAGNOSIS — N201 Calculus of ureter: Secondary | ICD-10-CM | POA: Diagnosis not present

## 2020-11-10 DIAGNOSIS — R1012 Left upper quadrant pain: Secondary | ICD-10-CM | POA: Diagnosis present

## 2020-11-10 LAB — CBC WITH DIFFERENTIAL/PLATELET
Abs Immature Granulocytes: 0.02 10*3/uL (ref 0.00–0.07)
Basophils Absolute: 0 10*3/uL (ref 0.0–0.1)
Basophils Relative: 0 %
Eosinophils Absolute: 0 10*3/uL (ref 0.0–0.5)
Eosinophils Relative: 0 %
HCT: 38.6 % (ref 36.0–46.0)
Hemoglobin: 13.1 g/dL (ref 12.0–15.0)
Immature Granulocytes: 0 %
Lymphocytes Relative: 13 %
Lymphs Abs: 1.1 10*3/uL (ref 0.7–4.0)
MCH: 30.9 pg (ref 26.0–34.0)
MCHC: 33.9 g/dL (ref 30.0–36.0)
MCV: 91 fL (ref 80.0–100.0)
Monocytes Absolute: 0.5 10*3/uL (ref 0.1–1.0)
Monocytes Relative: 6 %
Neutro Abs: 6.7 10*3/uL (ref 1.7–7.7)
Neutrophils Relative %: 81 %
Platelets: 362 10*3/uL (ref 150–400)
RBC: 4.24 MIL/uL (ref 3.87–5.11)
RDW: 12.7 % (ref 11.5–15.5)
WBC: 8.4 10*3/uL (ref 4.0–10.5)
nRBC: 0 % (ref 0.0–0.2)

## 2020-11-10 LAB — URINALYSIS, ROUTINE W REFLEX MICROSCOPIC
Bilirubin Urine: NEGATIVE
Glucose, UA: NEGATIVE mg/dL
Ketones, ur: 40 mg/dL — AB
Leukocytes,Ua: NEGATIVE
Nitrite: NEGATIVE
Protein, ur: NEGATIVE mg/dL
Specific Gravity, Urine: 1.01 (ref 1.005–1.030)
pH: 6 (ref 5.0–8.0)

## 2020-11-10 LAB — BASIC METABOLIC PANEL
Anion gap: 10 (ref 5–15)
BUN: 9 mg/dL (ref 6–20)
CO2: 26 mmol/L (ref 22–32)
Calcium: 9.1 mg/dL (ref 8.9–10.3)
Chloride: 98 mmol/L (ref 98–111)
Creatinine, Ser: 1.34 mg/dL — ABNORMAL HIGH (ref 0.44–1.00)
GFR, Estimated: 57 mL/min — ABNORMAL LOW (ref >60)
Glucose, Bld: 91 mg/dL (ref 70–99)
Potassium: 3.8 mmol/L (ref 3.5–5.1)
Sodium: 134 mmol/L — ABNORMAL LOW (ref 135–145)

## 2020-11-10 LAB — URINALYSIS, MICROSCOPIC (REFLEX)

## 2020-11-10 LAB — PREGNANCY, URINE: Preg Test, Ur: NEGATIVE

## 2020-11-10 MED ORDER — KETOROLAC TROMETHAMINE 10 MG PO TABS
10.0000 mg | ORAL_TABLET | Freq: Four times a day (QID) | ORAL | 0 refills | Status: DC | PRN
  Filled 2020-11-10: qty 20, 5d supply, fill #0

## 2020-11-10 MED ORDER — KETOROLAC TROMETHAMINE 10 MG PO TABS: 10.0000 mg | ORAL_TABLET | Freq: Four times a day (QID) | ORAL | 0 refills | Status: AC | PRN

## 2020-11-10 MED ORDER — HYDROMORPHONE HCL 1 MG/ML IJ SOLN
1.0000 mg | Freq: Once | INTRAMUSCULAR | Status: AC
  Administered 2020-11-10: 1 mg via INTRAVENOUS
  Filled 2020-11-10: qty 1

## 2020-11-10 MED ORDER — ONDANSETRON HCL 4 MG/2ML IJ SOLN
4.0000 mg | Freq: Once | INTRAMUSCULAR | Status: AC
  Administered 2020-11-10: 4 mg via INTRAVENOUS
  Filled 2020-11-10: qty 2

## 2020-11-10 NOTE — ED Notes (Signed)
Patient transported to CT 

## 2020-11-10 NOTE — ED Provider Notes (Signed)
[facility] EMERGENCY DEPARTMENT Provider Note   CSN: [id number] Arrival date & time: 11/10/20  1310     History Chief Complaint  Patient presents with   Abdominal Pain    Lisa Franklin is a 25 y.o. female.  She was seen in the emergency department 3 days ago and diagnosed with a 4 mm left mid ureteral kidney stone.  She was discharged home on oxycodone, and she states that the pain has worsened.  She is also having nausea and vomiting.  The history is provided by the patient.  Flank Pain This is a new problem. Episode onset: 3-4 days ago. The problem occurs constantly. The problem has not changed since onset.Associated symptoms include abdominal pain (LUQ). Pertinent negatives include no chest pain, no headaches and no shortness of breath. Nothing aggravates the symptoms. Nothing relieves the symptoms. Treatments tried: oxycodone, zofran, flomax. The treatment provided no relief.      Past Medical History:  Diagnosis Date   Eczema    PCOS (polycystic ovarian syndrome)     Patient Active Problem List   Diagnosis Date Noted   Eczema 04/03/2015   PCOS (polycystic ovarian syndrome) 10/13/2013   Acanthosis nigricans 10/06/2013    Past Surgical History:  Procedure Laterality Date   Unremarkable       OB History     Gravida  0   Para  0   Term  0   Preterm  0   AB  0   Living  0      SAB  0   IAB  0   Ectopic  0   Multiple  0   Live Births  0           Family History  Problem Relation Age of Onset   Hypertension Mother        Living   Hyperlipidemia Mother    Cancer - Other Mother        Endometrium   GER disease Mother    Hypertension Father        living   Diabetes Maternal Grandmother    Hypertension Maternal Grandmother    Hyperlipidemia Maternal Grandmother    Heart disease Maternal Grandfather    Hypertension Maternal Grandfather    Diabetes Maternal Aunt        x3   Heart disease Maternal Aunt        x1    Hypertension Maternal Aunt        x3   Asthma Sister     Social History   Tobacco Use   Smoking status: Former    Types: Cigarettes   Smokeless tobacco: Never  Vaping Use   Vaping Use: Never used  Substance Use Topics   Alcohol use: No   Drug use: No    Home Medications Prior to Admission medications   Medication Sig Start Date End Date Taking? Authorizing Provider  Norethindrone Acetate-Ethinyl Estrad-FE (BLISOVI 24 FE) 1-20 MG-MCG(24) tablet Take 1 tablet by mouth daily. 10/16/20   Reva Bores, MD  Norethindrone Acetate-Ethinyl Estrad-FE (LOESTRIN 24 FE) 1-20 MG-MCG(24) tablet Take 1 tablet by mouth daily. 09/26/19   Levie Heritage, DO  ondansetron (ZOFRAN ODT) 4 MG disintegrating tablet Take 1 tablet (4 mg total) by mouth every 8 (eight) hours as needed for nausea or vomiting. 11/07/20   Milagros Loll, MD  oxyCODONE-acetaminophen (PERCOCET) 5-325 MG tablet Take 1 tablet by mouth every 4 hours as needed 11/09/20  oxyCODONE-acetaminophen (PERCOCET/ROXICET) 5-325 MG tablet Take 1 tablet by mouth every 6 (six) hours as needed for up to 3 days for severe pain. 11/07/20 11/10/20  Milagros Loll, MD  tamsulosin (FLOMAX) 0.4 MG CAPS capsule Take 1 capsule (0.4 mg total) by mouth daily. 11/07/20   Milagros Loll, MD    Allergies    Patient has no known allergies.  Review of Systems   Review of Systems  Constitutional:  Negative for chills and fever.  HENT:  Negative for ear pain and sore throat.   Eyes:  Negative for pain and visual disturbance.  Respiratory:  Negative for cough and shortness of breath.   Cardiovascular:  Negative for chest pain and palpitations.  Gastrointestinal:  Positive for abdominal pain (LUQ), nausea and vomiting.  Genitourinary:  Positive for flank pain. Negative for dysuria and hematuria.  Musculoskeletal:  Negative for arthralgias and back pain.  Skin:  Negative for color change and rash.  Neurological:  Negative for seizures, syncope and  headaches.  All other systems reviewed and are negative.  Physical Exam Updated Vital Signs BP 132/82 (BP Location: Left Arm)   Pulse 76   Temp 99 F (37.2 C) (Oral)   Resp 16   Ht 5\' 3"  (1.6 m)   Wt 114.8 kg   LMP 11/03/2020 (Exact Date) Comment: neg upreg in er 11/07/20.  SpO2 100%   BMI 44.82 kg/m   Physical Exam Vitals and nursing note reviewed.  HENT:     Head: Normocephalic and atraumatic.  Eyes:     General: No scleral icterus. Pulmonary:     Effort: Pulmonary effort is normal. No respiratory distress.  Abdominal:     Palpations: Abdomen is soft.     Tenderness: There is abdominal tenderness in the left upper quadrant. There is left CVA tenderness. There is no right CVA tenderness, guarding or rebound.  Musculoskeletal:     Cervical back: Normal range of motion.  Skin:    General: Skin is warm and dry.  Neurological:     General: No focal deficit present.     Mental Status: She is alert.  Psychiatric:        Mood and Affect: Mood normal.    ED Results / Procedures / Treatments   Labs (all labs ordered are listed, but only abnormal results are displayed) Labs Reviewed  BASIC METABOLIC PANEL - Abnormal; Notable for the following components:      Result Value   Sodium 134 (*)    Creatinine, Ser 1.34 (*)    GFR, Estimated 57 (*)    All other components within normal limits  URINALYSIS, ROUTINE W REFLEX MICROSCOPIC - Abnormal; Notable for the following components:   Hgb urine dipstick SMALL (*)    Ketones, ur 40 (*)    All other components within normal limits  URINALYSIS, MICROSCOPIC (REFLEX) - Abnormal; Notable for the following components:   Bacteria, UA FEW (*)    All other components within normal limits  CBC WITH DIFFERENTIAL/PLATELET  PREGNANCY, URINE    EKG None  Radiology CT Renal Stone Study  Result Date: 11/10/2020 CLINICAL DATA:  Nephrolithiasis.  Abdominal pain. EXAM: CT ABDOMEN AND PELVIS WITHOUT CONTRAST TECHNIQUE: Multidetector CT  imaging of the abdomen and pelvis was performed following the standard protocol without IV contrast. COMPARISON:  November 07, 2020. FINDINGS: Lower chest: No acute abnormality. Hepatobiliary: No focal liver abnormality is seen. No gallstones, gallbladder wall thickening, or biliary dilatation. Pancreas: Unremarkable. No pancreatic ductal dilatation or  surrounding inflammatory changes. Spleen: Normal in size without focal abnormality. Adrenals/Urinary Tract: Adrenal glands appear normal. Bilateral nonobstructive nephrolithiasis is noted. Mild left hydroureteronephrosis is noted secondary to 6 mm ureteral calculus which has progressed significantly distally to the pelvis. Urinary bladder is unremarkable. Stomach/Bowel: Stomach is within normal limits. Appendix appears normal. No evidence of bowel wall thickening, distention, or inflammatory changes. Vascular/Lymphatic: No significant vascular findings are present. No enlarged abdominal or pelvic lymph nodes. Reproductive: Uterus and bilateral adnexa are unremarkable. Other: No abdominal wall hernia or abnormality. No abdominopelvic ascites. Musculoskeletal: No acute or significant osseous findings. IMPRESSION: Bilateral nonobstructive nephrolithiasis. Mild left hydroureteronephrosis is noted secondary to 6 mm ureteral calculus which has significantly progressed more distally into the pelvis since prior exam. Electronically Signed   By: Lupita Raider M.D.   On: 11/10/2020 15:50    Procedures Procedures   Medications Ordered in ED Medications  HYDROmorphone (DILAUDID) injection 1 mg (1 mg Intravenous Given 11/10/20 1546)  ondansetron (ZOFRAN) injection 4 mg (4 mg Intravenous Given 11/10/20 1546)    ED Course  I have reviewed the triage vital signs and the nursing notes.  Pertinent labs & imaging results that were available during my care of the patient were reviewed by me and considered in my medical decision making (see chart for details).    MDM  Rules/Calculators/A&P                           Zella Ball for her second visit for left-sided kidney stones.  She is complaining of increased pain, nausea, and vomiting.  She is well-appearing with normal vital signs.  ED work-up reveals the stone has moved distally which likely explains her increase in pain.  Hydronephrosis has improved.  Urinalysis is reassuring without evidence of infection.  No evidence of renal failure.  I have added Toradol to her regimen, and I have advised her to follow closely with urology.  No indication for emergent or urgent intervention today. Final Clinical Impression(s) / ED Diagnoses Final diagnoses:  Left ureteral stone    Rx / DC Orders ED Discharge Orders          Ordered    ketorolac (TORADOL) 10 MG tablet  Every 6 hours PRN        11/10/20 1707             Koleen Distance, MD 11/10/20 1710

## 2020-11-10 NOTE — ED Notes (Signed)
Patient given discharge instructions, all questions answered. Patient in possession of all belongings, directed to the discharge area  

## 2020-11-10 NOTE — ED Triage Notes (Signed)
Pt c/o continue to have severe pain from her kidney stones with no relief from prescribed medications.

## 2020-11-12 ENCOUNTER — Other Ambulatory Visit (HOSPITAL_BASED_OUTPATIENT_CLINIC_OR_DEPARTMENT_OTHER): Payer: Self-pay

## 2020-11-12 MED ORDER — SULFAMETHOXAZOLE-TRIMETHOPRIM 800-160 MG PO TABS
ORAL_TABLET | ORAL | 0 refills | Status: AC
  Filled 2020-11-12: qty 14, 7d supply, fill #0

## 2020-11-23 DIAGNOSIS — N201 Calculus of ureter: Secondary | ICD-10-CM | POA: Diagnosis not present

## 2021-03-26 IMAGING — CT CT RENAL STONE PROTOCOL
2 of 4 series · 16 of 46 positions shown, 18 images · non-contrast
Comparison: None.

CLINICAL DATA: Left flank pain

EXAM:
CT ABDOMEN AND PELVIS WITHOUT CONTRAST
TECHNIQUE: Multidetector CT imaging of the abdomen and pelvis was performed
following the standard protocol without oral or IV contrast.

[Series 3: axial st · axial · 0.90mm/px · z∈[-904,-444]mm · 13 of 102 slices shown, 15 images]
[im 5/102  soft-tissue]
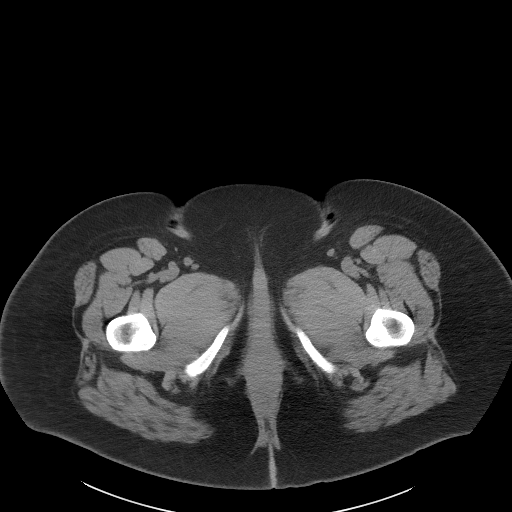
[im 5/102  bone]
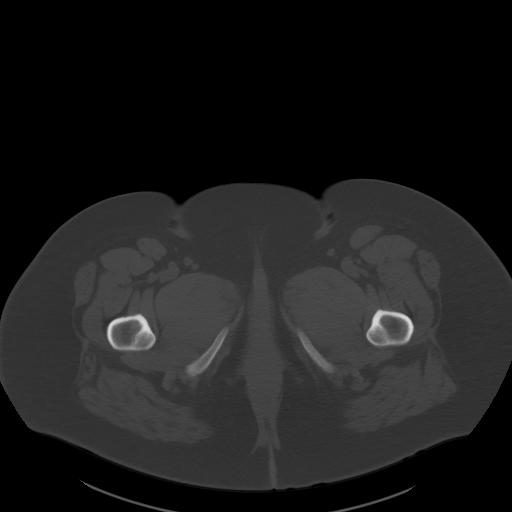
[im 14/102  soft-tissue]
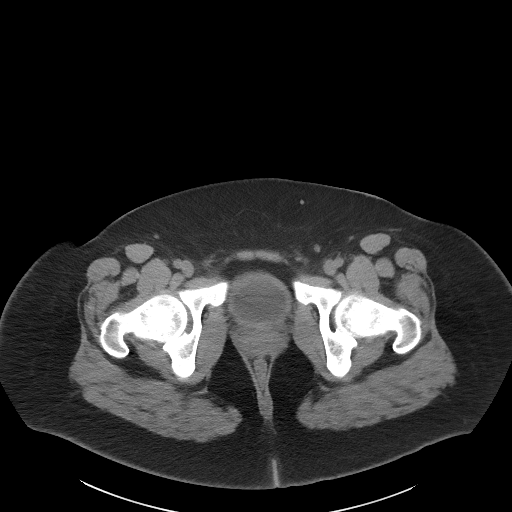
[im 22/102  soft-tissue]
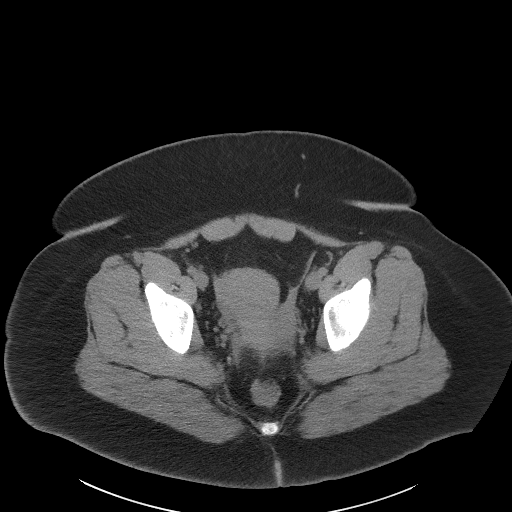
[im 27/102  soft-tissue]
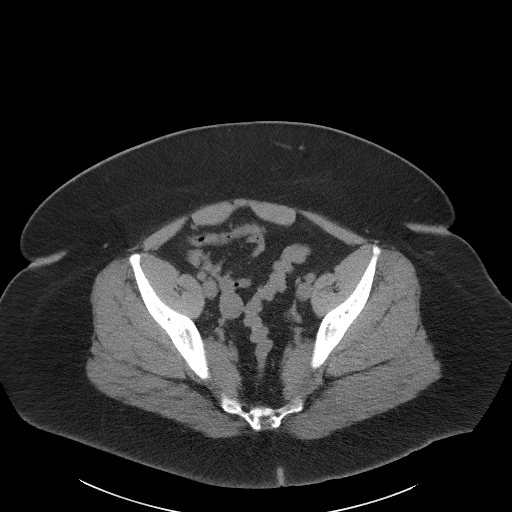
[im 36/102  soft-tissue]
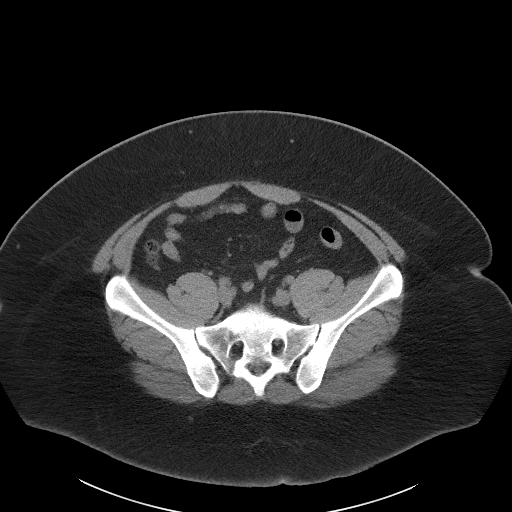
[im 44/102  soft-tissue]
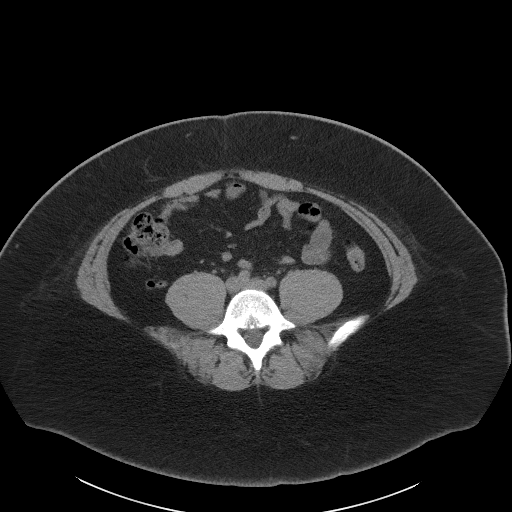
[im 53/102  soft-tissue]
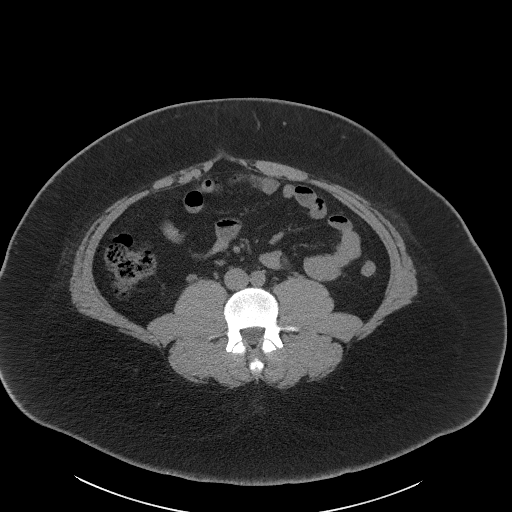
[im 58/102  soft-tissue]
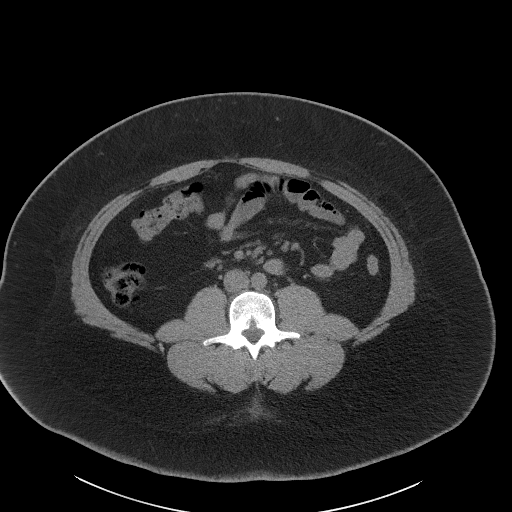
[im 66/102  soft-tissue]
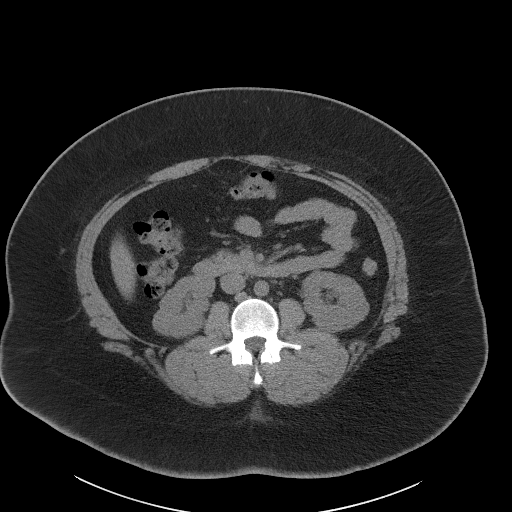
[im 66/102  bone]
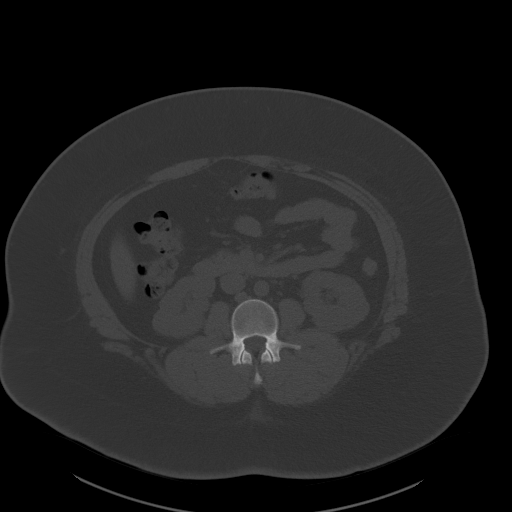
[im 75/102  soft-tissue]
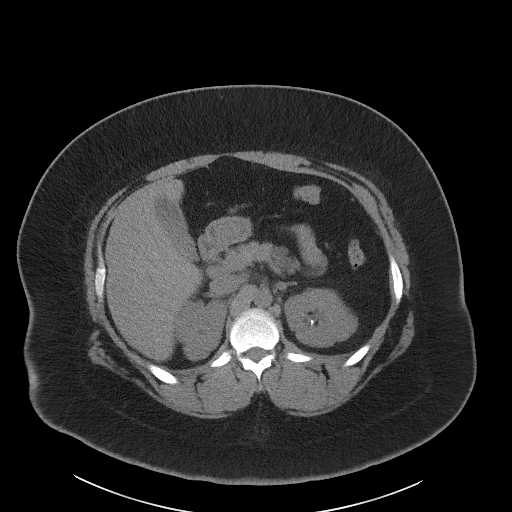
[im 80/102  soft-tissue]
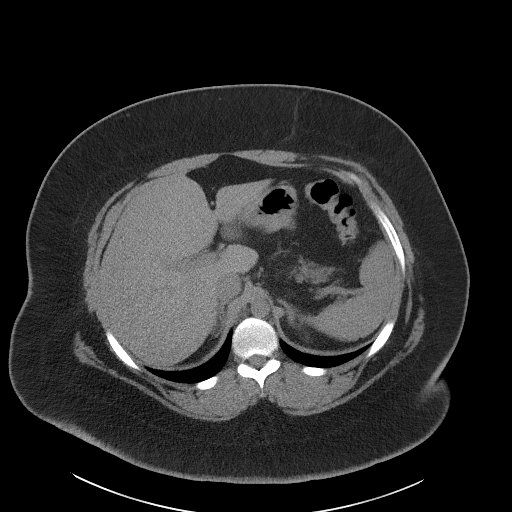
[im 88/102  soft-tissue]
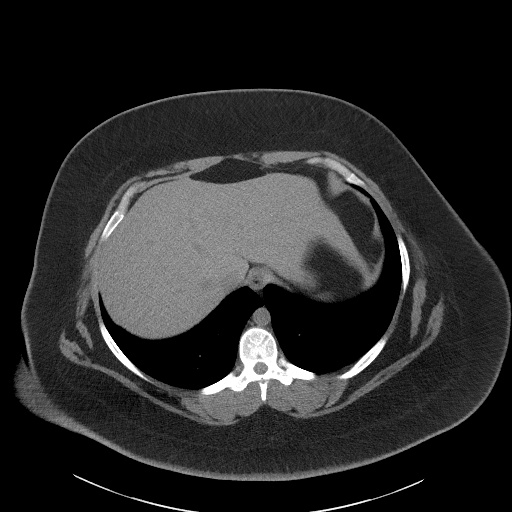
[im 97/102  soft-tissue]
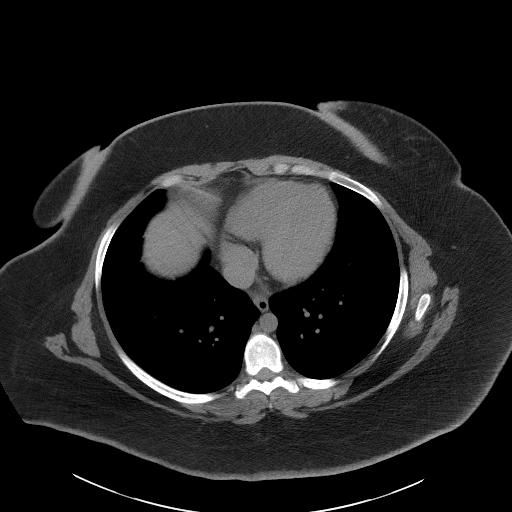

[Series 6: coronal st · coronal · 0.93mm/px · 3 of 109 slices shown]
[im 37/109  soft-tissue]
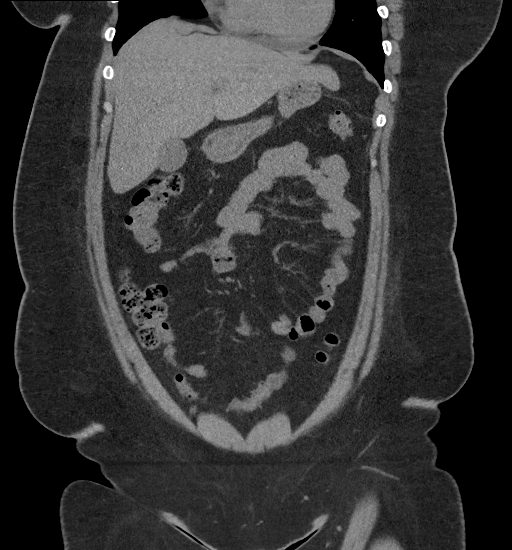
[im 49/109  soft-tissue]
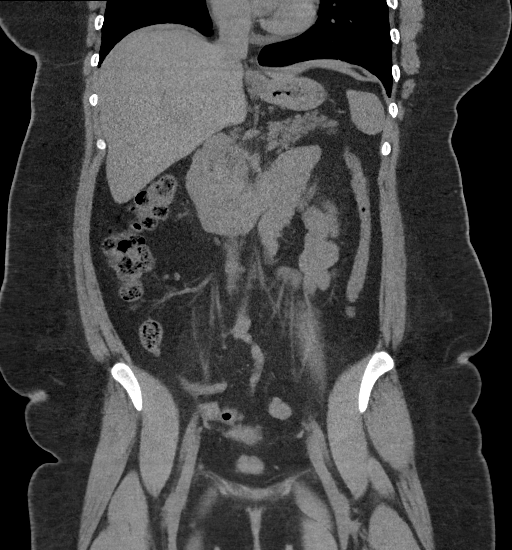
[im 61/109  soft-tissue]
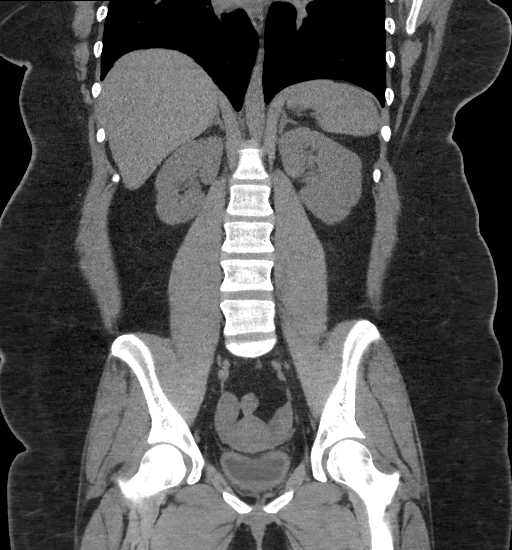

[16 of 46 positions shown; findings below may reference images not displayed]

FINDINGS: Lower chest: Lung bases are clear.

Hepatobiliary: No focal liver lesions are appreciable on this
noncontrast enhanced study. Gallbladder wall is not appreciably
thickened. There is no evident biliary duct dilatation.

Pancreas: No pancreatic mass or inflammatory focus.

Spleen: No splenic lesions are evident.

Adrenals/Urinary Tract: Adrenals bilaterally appear normal. There is
no evident renal mass on either side. There is minimal
hydronephrosis on the left. There is a 1 mm calculus in the mid left
kidney. There is a 1 mm calculus in the lower pole of the left
kidney. There is a 2 mm calculus in the upper pole of the left
kidney. There is no appreciable ureteral calculus on either side.
Urinary bladder is midline. The urinary bladder wall is felt to be
borderline thickened.

Stomach/Bowel: There is no appreciable bowel wall or mesenteric
thickening. The terminal ileum appears normal. There is no evident
bowel obstruction. There is no free air or portal venous air.

Vascular/Lymphatic: No abdominal aortic aneurysm. No arterial
vascular lesions are evident on this noncontrast enhanced study.
There is no appreciable adenopathy in the abdomen or pelvis by size
criteria, there are scattered mesenteric lymph nodes, primarily on
the right side.

Reproductive: The uterus is anteverted.  No evident pelvic mass.

Other: The appendix appears normal. There is no evident ascites or
abscess in the abdomen or pelvis. There is mild fat in the
umbilicus.

Musculoskeletal: No blastic or lytic bone lesions. No intramuscular
lesions are evident.
IMPRESSION: 1. Minimal hydronephrosis on the left. There are 1-2 mm
nonobstructing calculi in the left kidney. There is no ureteral
calculus on the left. Question recent calculus passage on the left.
Early pyelonephritis is a differential consideration for this
appearance. No evidence of perinephric fluid or renal abscess on
this noncontrast enhanced study.

2. Urinary bladder wall mildly thickened. Suspect a degree of
cystitis.

3. No bowel wall thickening or bowel obstruction. No abscess in the
abdomen or pelvis. Appendix appears normal.

4. Subcentimeter mesenteric lymph nodes are considered nonspecific.
In the appropriate clinical setting, a degree of mesenteric adenitis
could present in this manner. No adenopathy by size criteria
evident.

## 2022-09-10 DIAGNOSIS — Z1159 Encounter for screening for other viral diseases: Secondary | ICD-10-CM | POA: Diagnosis not present

## 2022-09-10 DIAGNOSIS — Z130 Encounter for screening for diseases of the blood and blood-forming organs and certain disorders involving the immune mechanism: Secondary | ICD-10-CM | POA: Diagnosis not present

## 2022-09-10 DIAGNOSIS — Z131 Encounter for screening for diabetes mellitus: Secondary | ICD-10-CM | POA: Diagnosis not present

## 2022-09-10 DIAGNOSIS — Z13220 Encounter for screening for lipoid disorders: Secondary | ICD-10-CM | POA: Diagnosis not present

## 2023-08-20 DIAGNOSIS — L918 Other hypertrophic disorders of the skin: Secondary | ICD-10-CM | POA: Diagnosis not present

## 2023-08-20 DIAGNOSIS — L910 Hypertrophic scar: Secondary | ICD-10-CM | POA: Diagnosis not present

## 2023-09-11 DIAGNOSIS — Z01419 Encounter for gynecological examination (general) (routine) without abnormal findings: Secondary | ICD-10-CM | POA: Diagnosis not present

## 2023-10-22 DIAGNOSIS — L821 Other seborrheic keratosis: Secondary | ICD-10-CM | POA: Diagnosis not present

## 2023-10-22 DIAGNOSIS — L910 Hypertrophic scar: Secondary | ICD-10-CM | POA: Diagnosis not present

## 2023-11-04 DIAGNOSIS — M5442 Lumbago with sciatica, left side: Secondary | ICD-10-CM | POA: Diagnosis not present

## 2023-11-04 DIAGNOSIS — M5441 Lumbago with sciatica, right side: Secondary | ICD-10-CM | POA: Diagnosis not present

## 2023-12-10 DIAGNOSIS — M545 Low back pain, unspecified: Secondary | ICD-10-CM | POA: Diagnosis not present

## 2023-12-10 DIAGNOSIS — M5441 Lumbago with sciatica, right side: Secondary | ICD-10-CM | POA: Diagnosis not present

## 2023-12-10 DIAGNOSIS — F17210 Nicotine dependence, cigarettes, uncomplicated: Secondary | ICD-10-CM | POA: Diagnosis not present

## 2023-12-10 DIAGNOSIS — Z7901 Long term (current) use of anticoagulants: Secondary | ICD-10-CM | POA: Diagnosis not present

## 2023-12-10 DIAGNOSIS — Z9189 Other specified personal risk factors, not elsewhere classified: Secondary | ICD-10-CM | POA: Diagnosis not present

## 2024-01-11 DIAGNOSIS — Z202 Contact with and (suspected) exposure to infections with a predominantly sexual mode of transmission: Secondary | ICD-10-CM | POA: Diagnosis not present
